# Patient Record
Sex: Female | Born: 1975
Health system: Southern US, Community
[De-identification: ages and names within clinical notes are randomized; demographics above are authoritative.]

## PROBLEM LIST (undated history)

## (undated) DIAGNOSIS — F329 Major depressive disorder, single episode, unspecified: Secondary | ICD-10-CM

## (undated) DIAGNOSIS — B1910 Unspecified viral hepatitis B without hepatic coma: Secondary | ICD-10-CM

## (undated) DIAGNOSIS — F32A Depression, unspecified: Secondary | ICD-10-CM

## (undated) DIAGNOSIS — I10 Essential (primary) hypertension: Secondary | ICD-10-CM

## (undated) DIAGNOSIS — M1990 Unspecified osteoarthritis, unspecified site: Secondary | ICD-10-CM

## (undated) DIAGNOSIS — T7840XA Allergy, unspecified, initial encounter: Secondary | ICD-10-CM

## (undated) DIAGNOSIS — E785 Hyperlipidemia, unspecified: Secondary | ICD-10-CM

## (undated) DIAGNOSIS — F319 Bipolar disorder, unspecified: Secondary | ICD-10-CM

## (undated) DIAGNOSIS — F419 Anxiety disorder, unspecified: Secondary | ICD-10-CM

## (undated) DIAGNOSIS — F209 Schizophrenia, unspecified: Secondary | ICD-10-CM

## (undated) HISTORY — DX: Unspecified osteoarthritis, unspecified site: M19.90

## (undated) HISTORY — DX: Allergy, unspecified, initial encounter: T78.40XA

## (undated) HISTORY — DX: Anxiety disorder, unspecified: F41.9

## (undated) HISTORY — DX: Hyperlipidemia, unspecified: E78.5

## (undated) HISTORY — PX: DIAGNOSTIC LAPAROSCOPY WITH REMOVAL OF ECTOPIC PREGNANCY: SHX6449

---

## 2002-02-28 ENCOUNTER — Emergency Department (HOSPITAL_COMMUNITY): Admission: EM | Admit: 2002-02-28 | Discharge: 2002-02-28 | Payer: Self-pay | Admitting: Emergency Medicine

## 2002-03-02 ENCOUNTER — Emergency Department (HOSPITAL_COMMUNITY): Admission: EM | Admit: 2002-03-02 | Discharge: 2002-03-02 | Payer: Self-pay | Admitting: Emergency Medicine

## 2003-03-03 ENCOUNTER — Emergency Department (HOSPITAL_COMMUNITY): Admission: EM | Admit: 2003-03-03 | Discharge: 2003-03-03 | Payer: Self-pay | Admitting: Internal Medicine

## 2005-07-18 ENCOUNTER — Emergency Department (HOSPITAL_COMMUNITY): Admission: EM | Admit: 2005-07-18 | Discharge: 2005-07-18 | Payer: Self-pay | Admitting: Emergency Medicine

## 2005-10-23 ENCOUNTER — Emergency Department (HOSPITAL_COMMUNITY): Admission: EM | Admit: 2005-10-23 | Discharge: 2005-10-23 | Payer: Self-pay | Admitting: Emergency Medicine

## 2005-11-01 ENCOUNTER — Ambulatory Visit: Payer: Self-pay | Admitting: Orthopedic Surgery

## 2007-11-24 ENCOUNTER — Emergency Department (HOSPITAL_COMMUNITY): Admission: EM | Admit: 2007-11-24 | Discharge: 2007-11-24 | Payer: Self-pay | Admitting: Emergency Medicine

## 2008-04-26 ENCOUNTER — Emergency Department (HOSPITAL_COMMUNITY): Admission: EM | Admit: 2008-04-26 | Discharge: 2008-04-26 | Payer: Self-pay | Admitting: Emergency Medicine

## 2008-09-25 ENCOUNTER — Emergency Department (HOSPITAL_COMMUNITY): Admission: EM | Admit: 2008-09-25 | Discharge: 2008-09-25 | Payer: Self-pay | Admitting: Emergency Medicine

## 2009-03-02 ENCOUNTER — Emergency Department (HOSPITAL_COMMUNITY): Admission: EM | Admit: 2009-03-02 | Discharge: 2009-03-03 | Payer: Self-pay | Admitting: Emergency Medicine

## 2009-11-20 ENCOUNTER — Emergency Department (HOSPITAL_COMMUNITY): Admission: EM | Admit: 2009-11-20 | Discharge: 2009-11-20 | Payer: Self-pay | Admitting: Emergency Medicine

## 2010-05-19 ENCOUNTER — Inpatient Hospital Stay (HOSPITAL_COMMUNITY)
Admission: EM | Admit: 2010-05-19 | Discharge: 2010-05-29 | Disposition: A | Discharge status: Other Acute Inpatient Hospital | Payer: Self-pay | Source: Home / Self Care | Admitting: Emergency Medicine

## 2010-05-20 ENCOUNTER — Ambulatory Visit: Payer: Self-pay | Admitting: Gastroenterology

## 2010-05-23 ENCOUNTER — Ambulatory Visit: Payer: Self-pay | Admitting: Gastroenterology

## 2010-05-25 ENCOUNTER — Ambulatory Visit: Payer: Self-pay | Admitting: Internal Medicine

## 2010-05-27 ENCOUNTER — Ambulatory Visit: Payer: Self-pay | Admitting: Internal Medicine

## 2010-05-28 ENCOUNTER — Ambulatory Visit: Payer: Self-pay | Admitting: Gastroenterology

## 2010-05-29 ENCOUNTER — Ambulatory Visit: Payer: Self-pay | Admitting: Psychiatry

## 2010-05-29 ENCOUNTER — Inpatient Hospital Stay (HOSPITAL_COMMUNITY): Admission: AD | Admit: 2010-05-29 | Discharge: 2010-06-08 | Payer: Self-pay | Admitting: Psychiatry

## 2010-05-29 ENCOUNTER — Telehealth: Payer: Self-pay | Admitting: Gastroenterology

## 2010-06-02 ENCOUNTER — Emergency Department (HOSPITAL_COMMUNITY): Admission: EM | Admit: 2010-06-02 | Discharge: 2010-06-02 | Payer: Self-pay | Admitting: Emergency Medicine

## 2010-06-26 ENCOUNTER — Ambulatory Visit: Payer: Self-pay | Admitting: Family Medicine

## 2010-07-03 LAB — CONVERTED CEMR LAB
ALT: 68 units/L — ABNORMAL HIGH (ref 0–35)
AST: 38 units/L — ABNORMAL HIGH (ref 0–37)
Albumin: 3.7 g/dL (ref 3.5–5.2)
Alkaline Phosphatase: 74 units/L (ref 39–117)
BUN: 9 mg/dL (ref 6–23)
Basophils Absolute: 0 10*3/uL (ref 0.0–0.1)
Basophils Relative: 1 % (ref 0–1)
CO2: 24 meq/L (ref 19–32)
Calcium: 9 mg/dL (ref 8.4–10.5)
Chloride: 107 meq/L (ref 96–112)
Creatinine, Ser: 0.78 mg/dL (ref 0.40–1.20)
Eosinophils Absolute: 0.1 10*3/uL (ref 0.0–0.7)
Eosinophils Relative: 1 % (ref 0–5)
Glucose, Bld: 88 mg/dL (ref 70–99)
HCT: 39.5 % (ref 36.0–46.0)
Hemoglobin: 13.1 g/dL (ref 12.0–15.0)
Lymphocytes Relative: 28 % (ref 12–46)
Lymphs Abs: 1.7 10*3/uL (ref 0.7–4.0)
MCHC: 33.2 g/dL (ref 30.0–36.0)
MCV: 99 fL (ref 78.0–100.0)
Monocytes Absolute: 0.8 10*3/uL (ref 0.1–1.0)
Monocytes Relative: 13 % — ABNORMAL HIGH (ref 3–12)
Neutro Abs: 3.5 10*3/uL (ref 1.7–7.7)
Neutrophils Relative %: 58 % (ref 43–77)
Platelets: 288 10*3/uL (ref 150–400)
Potassium: 4.2 meq/L (ref 3.5–5.3)
RBC: 3.99 M/uL (ref 3.87–5.11)
RDW: 15.2 % (ref 11.5–15.5)
Sodium: 142 meq/L (ref 135–145)
TSH: 0.435 microintl units/mL (ref 0.350–4.500)
Total Bilirubin: 4.7 mg/dL — ABNORMAL HIGH (ref 0.3–1.2)
Total Protein: 6.6 g/dL (ref 6.0–8.3)
Vit D, 25-Hydroxy: 14 ng/mL — ABNORMAL LOW (ref 30–89)
WBC: 6 10*3/uL (ref 4.0–10.5)

## 2010-07-23 ENCOUNTER — Encounter (INDEPENDENT_AMBULATORY_CARE_PROVIDER_SITE_OTHER): Payer: Self-pay | Admitting: *Deleted

## 2010-08-03 ENCOUNTER — Ambulatory Visit (HOSPITAL_COMMUNITY): Admission: RE | Admit: 2010-08-03 | Discharge: 2010-08-03 | Payer: Self-pay | Admitting: Family Medicine

## 2010-08-13 ENCOUNTER — Encounter (INDEPENDENT_AMBULATORY_CARE_PROVIDER_SITE_OTHER): Payer: Self-pay | Admitting: *Deleted

## 2010-08-23 ENCOUNTER — Emergency Department (HOSPITAL_COMMUNITY)
Admission: EM | Admit: 2010-08-23 | Discharge: 2010-08-23 | Payer: Self-pay | Source: Home / Self Care | Admitting: Family Medicine

## 2010-08-23 ENCOUNTER — Emergency Department (HOSPITAL_COMMUNITY): Admission: EM | Admit: 2010-08-23 | Discharge: 2010-08-24 | Payer: Self-pay | Admitting: Emergency Medicine

## 2010-10-01 ENCOUNTER — Emergency Department (HOSPITAL_COMMUNITY): Admission: EM | Admit: 2010-10-01 | Discharge: 2010-04-19 | Payer: Self-pay | Admitting: Emergency Medicine

## 2010-10-20 ENCOUNTER — Encounter (INDEPENDENT_AMBULATORY_CARE_PROVIDER_SITE_OTHER): Payer: Self-pay | Admitting: Family Medicine

## 2010-10-20 LAB — CONVERTED CEMR LAB: hCG, Beta Chain, Quant, S: 245.2 milliintl units/mL

## 2010-10-25 ENCOUNTER — Inpatient Hospital Stay (HOSPITAL_COMMUNITY)
Admission: AD | Admit: 2010-10-25 | Discharge: 2010-10-26 | Payer: Self-pay | Source: Home / Self Care | Attending: Obstetrics & Gynecology | Admitting: Obstetrics & Gynecology

## 2010-10-29 ENCOUNTER — Ambulatory Visit (HOSPITAL_COMMUNITY)
Admission: RE | Admit: 2010-10-29 | Discharge: 2010-10-29 | Payer: Self-pay | Source: Home / Self Care | Attending: Obstetrics & Gynecology | Admitting: Obstetrics & Gynecology

## 2010-10-29 LAB — HCG, QUANTITATIVE, PREGNANCY: hCG, Beta Chain, Quant, S: 3780 m[IU]/mL — ABNORMAL HIGH (ref <5)

## 2010-11-10 ENCOUNTER — Inpatient Hospital Stay (HOSPITAL_COMMUNITY)
Admission: AD | Admit: 2010-11-10 | Discharge: 2010-11-10 | Disposition: A | Discharge status: Other Acute Inpatient Hospital | Payer: Self-pay | Source: Home / Self Care | Attending: Obstetrics & Gynecology | Admitting: Obstetrics & Gynecology

## 2010-11-11 LAB — CBC
HCT: 36.9 % (ref 36.0–46.0)
Hemoglobin: 13 g/dL (ref 12.0–15.0)
MCH: 33.2 pg (ref 26.0–34.0)
MCHC: 35.2 g/dL (ref 30.0–36.0)
MCV: 94.1 fL (ref 78.0–100.0)
Platelets: 193 10*3/uL (ref 150–400)
RBC: 3.92 MIL/uL (ref 3.87–5.11)
RDW: 13.4 % (ref 11.5–15.5)
WBC: 12.4 10*3/uL — ABNORMAL HIGH (ref 4.0–10.5)

## 2010-11-11 LAB — COMPREHENSIVE METABOLIC PANEL
ALT: 12 U/L (ref 0–35)
AST: 15 U/L (ref 0–37)
Albumin: 3.4 g/dL — ABNORMAL LOW (ref 3.5–5.2)
Alkaline Phosphatase: 41 U/L (ref 39–117)
BUN: 5 mg/dL — ABNORMAL LOW (ref 6–23)
CO2: 22 mEq/L (ref 19–32)
Calcium: 9.4 mg/dL (ref 8.4–10.5)
Chloride: 106 mEq/L (ref 96–112)
Creatinine, Ser: 0.6 mg/dL (ref 0.4–1.2)
GFR calc Af Amer: >60 mL/min (ref >60)
GFR calc non Af Amer: >60 mL/min (ref >60)
Glucose, Bld: 94 mg/dL (ref 70–99)
Potassium: 3.7 mEq/L (ref 3.5–5.1)
Sodium: 135 mEq/L (ref 135–145)
Total Bilirubin: 0.7 mg/dL (ref 0.3–1.2)
Total Protein: 6.2 g/dL (ref 6.0–8.3)

## 2010-11-11 LAB — TYPE AND SCREEN
ABO/RH(D): B POS
Antibody Screen: NEGATIVE

## 2010-11-11 LAB — HCG, QUANTITATIVE, PREGNANCY: hCG, Beta Chain, Quant, S: 10335 m[IU]/mL — ABNORMAL HIGH (ref <5)

## 2010-11-19 ENCOUNTER — Ambulatory Visit: Admit: 2010-11-19 | Payer: Self-pay | Admitting: Obstetrics and Gynecology

## 2010-11-24 NOTE — Miscellaneous (Signed)
Summary: CONSULTATION  Clinical Lists Changes  NAME:  STEPHANEY, [name]              ACCOUNT NO.:  [id number]      MEDICAL RECORD NO.:  [id number]          PATIENT TYPE:  INP      LOCATION:  A332                          FACILITY:  [facility]      PHYSICIAN:  Jonette Eva, M.D.     DATE OF BIRTH:  [birth year]      DATE OF CONSULTATION:  05/20/2010   DATE OF DISCHARGE:                                    CONSULTATION      HISTORY OF PRESENT ILLNESS:  The patient is a 35 year old African   American female who presented to the hospital with complaints of 2-week   history of nausea, vomiting, abdominal distention, hematemesis.  She   also yesterday developed chest discomfort associated with shortness of   breath which really prompted the ED visit.  Upon evaluation, she had a   chest x-ray which was unremarkable although lung volumes were low.  A CT   of the abdomen and pelvis was unremarkable except for diverticula, but   no evidence of diverticulosis.  Liver appeared normal.  Lab work showed   total bilirubin of 10.2, alk-phos 141, AST 1296, ALT 1884, albumin 3.1.   Amylase and lipase were normal.  Her INR was 1.23, PTT 41.  Pregnancy   test negative.  Cardiac markers point of care were negative.  This   morning her INR is 1.24, PTT is 41, total bilirubin 10.8, alk phos 139,   AST 1353, ALT 1947.  BUN and creatinine is normal.  Acetaminophen level   less than 10.  She has abdominal ultrasound scheduled.      The patient states she has been having unprotected sex with her fiance   who was diagnosed with hepatitis back in May according to the patient.   She states that he was told that he was cleared.  Apparently he took   some shots.  She states that they were told it could not be transmitted   sexually.  She states the last time she ate out prior to her illness was   pizza.  She denies any diarrhea associated with this illness.  Bowel   movements have been normal.  No blood in stool or melena.      MEDICATIONS AT HOME:   1. Alleve p.r.n. but not daily.   2. Tylenol p.r.n. headache, but not daily.      ALLERGIES:  No known drug allergies.      PAST MEDICAL HISTORY:   1. Hypertension.   2. Asthma.   3. Psoriasis.   4. Headaches.   5. Negative for liver disease.  She states her mother has history of       blood clots and diabetes.      PAST SURGICAL HISTORY:  Dilation and curettage for miscarriage.      SOCIAL HISTORY:  She is engaged to be married.  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  She quit   smoking 2 days ago due to the nausea, vomiting.  She is going to try to   maintain cessation.  Denies any alcohol or drug use.      REVIEW OF SYSTEMS:  See HPI for GI and constitutional.  She has been   feeling very fatigued.  CARDIOPULMONARY:  See HPI.  GENITOURINARY:  No   dysuria, hematuria.  She has had urinary frequency.      PHYSICAL EXAMINATION:  VITAL SIGNS:  Temperature 98, pulse 54,   respirations 20, blood pressure 122/65, weight is 96.6 kg, height 64   inches.   GENERAL:  Pleasant, obese, black female in no acute distress.   SKIN:  Warm and dry.   HEENT:  Sclerae are icteric.  Oropharyngeal mucosa moist.   CHEST:  Lungs clear to auscultation.   CARDIAC:  Exam reveals regular rate and rhythm.  No murmurs.   ABDOMEN:  Obese, soft, nontender, nondistended.  No organomegaly or   masses.  No abdominal bruits or hernias.  No rebound or guarding.   LOWER EXTREMITIES:  No edema.      LABORATORY DATA:  As outlined above.      IMPRESSION:  The patient is a pleasant 35 year old lady with acute   hepatitis, unclear etiology.  She has had unprotected sexual contact   with her boyfriend who has had a history of hepatitis, unclear type, but   apparently received shots back in May.  No further details are available   at this time.  Differential diagnosis at this time includes acute viral   hepatitis, autoimmune hepatitis, gout  obstructing jaundice on medication   effect.  She also has hematemesis likely due to Mallory-Weiss tear.      RECOMMENDATIONS:   1. Labs including markers for autoimmune hepatitis and PBC, LFTs,       viral markers.   2. Follow-up abdominal ultrasound.   3. Will discuss further with Dr. Darrick Penna regarding possible need for       EGD given hematemesis.      ADDEDNDUM [address]: ELEVATED HFP 2o TO ACUTE HEP B.         Tana Coast, P.A.         ______________________________   Jonette Eva, M.D.         LL/MEDQ  D:  05/20/2010  T:  05/20/2010  Job:  [id number]      cc:   Melissa L. Ladona Ridgel, MD      Electronically Signed by Tana Coast P.A. on 06/01/2010 08:40:24 AM   Electronically Signed by Jonette Eva M.D. on 07/10/2010 03:25:46 PM

## 2010-11-24 NOTE — Progress Notes (Signed)
Summary: ACUTE HEP B  Pt admitted with Acute Hep B. OPV w/ SLF in one month E:30 visit. [place] Bali MD  May 29, 2010 1:32 PM  Appended Document: ACUTE HEP B LMOM for pt to call us to set up OV  Appended Document: ACUTE HEP B Pt aware of appt for 07/01/10 @ 10am w/SF

## 2010-11-24 NOTE — Letter (Signed)
Summary: Recall Office Visit  [facility] [place] [facility]  [address]   [place], [place] [address]   Phone: [phone]  Fax: [phone]      August 13, 2010   [name] [address] [place], [place]  [address] Nov 18, 1975   Dear Ms. Berrocal,   According to our records, it is time for you to schedule a follow-up office visit with [place].   At your convenience, please call [phone] to schedule an office visit. If you have any questions, concerns, or feel that this letter is in error, we would appreciate your call.   Sincerely,    Rosine Beat  [facility] Ph: [phone]   Fax: [phone]

## 2010-11-24 NOTE — Letter (Signed)
Summary: Recall Office Visit  [facility]  [address]   [place], [place] [address]   Phone: [phone]  Fax: [phone]      August 13, 2010   [name] [address] [place] [address] [place], [place]  [address] [birth year]   Dear [name],   According to our records, it is time for you to schedule a follow-up office visit with [place].   At your convenience, please call [phone] to schedule an office visit. If you have any questions, concerns, or feel that this letter is in error, we would appreciate your call.   Sincerely,    Rosine Beat  [facility] Ph: [phone]   Fax: [phone]  Appended Document: Recall Office Visit PATIENT CALLED AND STATED THAT SHE HAS MOVED TO [place] AND IT IS HARD FOR HER TO GET TRANSPORTATION TO [place] ANYMORE. SHE WILL CALL AND SCHEDULE AN APPT WHEN SHE CAN GET TRANSPORTATION TO GET HERE

## 2011-01-04 LAB — WET PREP, GENITAL: Clue Cells Wet Prep HPF POC: NONE SEEN

## 2011-01-04 LAB — CBC
Hemoglobin: 13.4 g/dL (ref 12.0–15.0)
RBC: 4.04 MIL/uL (ref 3.87–5.11)

## 2011-01-04 LAB — URINALYSIS, ROUTINE W REFLEX MICROSCOPIC
Leukocytes, UA: NEGATIVE
Nitrite: NEGATIVE
Protein, ur: NEGATIVE mg/dL
Urobilinogen, UA: 1 mg/dL (ref 0.0–1.0)

## 2011-01-04 LAB — HCG, QUANTITATIVE, PREGNANCY: hCG, Beta Chain, Quant, S: 1518 m[IU]/mL — ABNORMAL HIGH (ref <5)

## 2011-01-04 LAB — ABO/RH: ABO/RH(D): B POS

## 2011-01-04 LAB — GC/CHLAMYDIA PROBE AMP, GENITAL
Chlamydia, DNA Probe: NEGATIVE
GC Probe Amp, Genital: NEGATIVE

## 2011-01-04 LAB — URINE MICROSCOPIC-ADD ON

## 2011-01-06 LAB — URINALYSIS, ROUTINE W REFLEX MICROSCOPIC
Bilirubin Urine: NEGATIVE
Glucose, UA: NEGATIVE mg/dL
Hgb urine dipstick: NEGATIVE
Ketones, ur: 15 mg/dL — AB
Nitrite: NEGATIVE
pH: 6 (ref 5.0–8.0)

## 2011-01-06 LAB — CBC
Hemoglobin: 14.1 g/dL (ref 12.0–15.0)
MCHC: 33.3 g/dL (ref 30.0–36.0)
Platelets: 232 10*3/uL (ref 150–400)
RBC: 4.3 MIL/uL (ref 3.87–5.11)

## 2011-01-06 LAB — DIFFERENTIAL
Eosinophils Absolute: 0.1 10*3/uL (ref 0.0–0.7)
Eosinophils Relative: 1 % (ref 0–5)
Lymphs Abs: 4.3 10*3/uL — ABNORMAL HIGH (ref 0.7–4.0)
Monocytes Absolute: 0.6 10*3/uL (ref 0.1–1.0)

## 2011-01-06 LAB — COMPREHENSIVE METABOLIC PANEL
ALT: 16 U/L (ref 0–35)
AST: 22 U/L (ref 0–37)
Albumin: 4 g/dL (ref 3.5–5.2)
CO2: 25 mEq/L (ref 19–32)
Calcium: 9.8 mg/dL (ref 8.4–10.5)
Chloride: 103 mEq/L (ref 96–112)
GFR calc Af Amer: >60 mL/min (ref >60)
GFR calc non Af Amer: >60 mL/min (ref >60)
Sodium: 138 mEq/L (ref 135–145)

## 2011-01-06 LAB — URINE MICROSCOPIC-ADD ON

## 2011-01-06 LAB — LIPASE, BLOOD: Lipase: 19 U/L (ref 11–59)

## 2011-01-06 LAB — POCT PREGNANCY, URINE: Preg Test, Ur: NEGATIVE

## 2011-01-08 LAB — PROTIME-INR
INR: 1.28 (ref 0.00–1.49)
INR: 1.34 (ref 0.00–1.49)
INR: 1.21 (ref 0.00–1.49)
INR: 1.22 (ref 0.00–1.49)
Prothrombin Time: 14.9 seconds (ref 11.6–15.2)
Prothrombin Time: 15.5 seconds — ABNORMAL HIGH (ref 11.6–15.2)
Prothrombin Time: 15.6 seconds — ABNORMAL HIGH (ref 11.6–15.2)

## 2011-01-08 LAB — DIFFERENTIAL
Basophils Absolute: 0 10*3/uL (ref 0.0–0.1)
Basophils Relative: 0 % (ref 0–1)
Eosinophils Absolute: 0.2 10*3/uL (ref 0.0–0.7)
Lymphocytes Relative: 29 % (ref 12–46)
Lymphs Abs: 2.7 10*3/uL (ref 0.7–4.0)
Monocytes Relative: 9 % (ref 3–12)
Neutro Abs: 6.6 10*3/uL (ref 1.7–7.7)
Neutrophils Relative %: 61 % (ref 43–77)
Neutrophils Relative %: 69 % (ref 43–77)

## 2011-01-08 LAB — COMPREHENSIVE METABOLIC PANEL
ALT: 1002 U/L — ABNORMAL HIGH (ref 0–35)
AST: 748 U/L — ABNORMAL HIGH (ref 0–37)
Albumin: 2.4 g/dL — ABNORMAL LOW (ref 3.5–5.2)
Alkaline Phosphatase: 119 U/L — ABNORMAL HIGH (ref 39–117)
Alkaline Phosphatase: 130 U/L — ABNORMAL HIGH (ref 39–117)
Alkaline Phosphatase: 128 U/L — ABNORMAL HIGH (ref 39–117)
BUN: 2 mg/dL — ABNORMAL LOW (ref 6–23)
BUN: 4 mg/dL — ABNORMAL LOW (ref 6–23)
CO2: 23 mEq/L (ref 19–32)
CO2: 23 mEq/L (ref 19–32)
CO2: 23 mEq/L (ref 19–32)
Calcium: 8.6 mg/dL (ref 8.4–10.5)
Chloride: 103 mEq/L (ref 96–112)
Creatinine, Ser: 0.79 mg/dL (ref 0.4–1.2)
Creatinine, Ser: 0.84 mg/dL (ref 0.4–1.2)
GFR calc Af Amer: >60 mL/min (ref >60)
GFR calc non Af Amer: >60 mL/min (ref >60)
GFR calc non Af Amer: >60 mL/min (ref >60)
Glucose, Bld: 110 mg/dL — ABNORMAL HIGH (ref 70–99)
Glucose, Bld: 82 mg/dL (ref 70–99)
Glucose, Bld: 81 mg/dL (ref 70–99)
Potassium: 3.7 mEq/L (ref 3.5–5.1)
Potassium: 3.7 mEq/L (ref 3.5–5.1)
Sodium: 137 mEq/L (ref 135–145)
Total Bilirubin: 16.9 mg/dL — ABNORMAL HIGH (ref 0.3–1.2)
Total Bilirubin: 27.4 mg/dL (ref 0.3–1.2)
Total Protein: 5.6 g/dL — ABNORMAL LOW (ref 6.0–8.3)
Total Protein: 6.8 g/dL (ref 6.0–8.3)

## 2011-01-08 LAB — HEPATIC FUNCTION PANEL
ALT: 1180 U/L — ABNORMAL HIGH (ref 0–35)
ALT: 1223 U/L — ABNORMAL HIGH (ref 0–35)
ALT: 1509 U/L — ABNORMAL HIGH (ref 0–35)
AST: 1037 U/L — ABNORMAL HIGH (ref 0–37)
Albumin: 2.3 g/dL — ABNORMAL LOW (ref 3.5–5.2)
Albumin: 2.4 g/dL — ABNORMAL LOW (ref 3.5–5.2)
Alkaline Phosphatase: 132 U/L — ABNORMAL HIGH (ref 39–117)
Alkaline Phosphatase: 119 U/L — ABNORMAL HIGH (ref 39–117)
Alkaline Phosphatase: 129 U/L — ABNORMAL HIGH (ref 39–117)
Alkaline Phosphatase: 121 U/L — ABNORMAL HIGH (ref 39–117)
Bilirubin, Direct: 13.1 mg/dL — ABNORMAL HIGH (ref 0.0–0.3)
Bilirubin, Direct: 15.7 mg/dL — ABNORMAL HIGH (ref 0.0–0.3)
Indirect Bilirubin: 10.1 mg/dL — ABNORMAL HIGH (ref 0.3–0.9)
Indirect Bilirubin: 13.2 mg/dL — ABNORMAL HIGH (ref 0.3–0.9)
Total Bilirubin: 23.9 mg/dL (ref 0.3–1.2)
Total Bilirubin: 18.8 mg/dL (ref 0.3–1.2)
Total Bilirubin: 19.2 mg/dL (ref 0.3–1.2)
Total Bilirubin: 28.9 mg/dL (ref 0.3–1.2)
Total Protein: 5.9 g/dL — ABNORMAL LOW (ref 6.0–8.3)

## 2011-01-08 LAB — CBC
HCT: 38.3 % (ref 36.0–46.0)
Hemoglobin: 13.2 g/dL (ref 12.0–15.0)
Hemoglobin: 13.2 g/dL (ref 12.0–15.0)
MCH: 33.8 pg (ref 26.0–34.0)
MCH: 34.2 pg — ABNORMAL HIGH (ref 26.0–34.0)
MCHC: 34.5 g/dL (ref 30.0–36.0)
MCV: 97.7 fL (ref 78.0–100.0)
MCV: 97.8 fL (ref 78.0–100.0)
Platelets: 179 10*3/uL (ref 150–400)
RBC: 3.86 MIL/uL — ABNORMAL LOW (ref 3.87–5.11)
RDW: 18 % — ABNORMAL HIGH (ref 11.5–15.5)
WBC: 9.2 10*3/uL (ref 4.0–10.5)

## 2011-01-08 LAB — WOUND CULTURE: Gram Stain: NONE SEEN

## 2011-01-08 LAB — POTASSIUM: Potassium: 4.5 mEq/L (ref 3.5–5.1)

## 2011-01-09 LAB — COMPREHENSIVE METABOLIC PANEL
ALT: 1754 U/L — ABNORMAL HIGH (ref 0–35)
ALT: 1947 U/L — ABNORMAL HIGH (ref 0–35)
AST: 1296 U/L — ABNORMAL HIGH (ref 0–37)
Albumin: 3.1 g/dL — ABNORMAL LOW (ref 3.5–5.2)
Alkaline Phosphatase: 139 U/L — ABNORMAL HIGH (ref 39–117)
BUN: 2 mg/dL — ABNORMAL LOW (ref 6–23)
BUN: 4 mg/dL — ABNORMAL LOW (ref 6–23)
CO2: 21 mEq/L (ref 19–32)
CO2: 24 mEq/L (ref 19–32)
Calcium: 8.3 mg/dL — ABNORMAL LOW (ref 8.4–10.5)
Calcium: 8.5 mg/dL (ref 8.4–10.5)
Chloride: 107 mEq/L (ref 96–112)
Chloride: 108 mEq/L (ref 96–112)
Creatinine, Ser: 0.7 mg/dL (ref 0.4–1.2)
Creatinine, Ser: 0.76 mg/dL (ref 0.4–1.2)
GFR calc Af Amer: >60 mL/min (ref >60)
GFR calc non Af Amer: >60 mL/min (ref >60)
GFR calc non Af Amer: >60 mL/min (ref >60)
Glucose, Bld: 86 mg/dL (ref 70–99)
Glucose, Bld: 88 mg/dL (ref 70–99)
Potassium: 3.4 mEq/L — ABNORMAL LOW (ref 3.5–5.1)
Sodium: 137 mEq/L (ref 135–145)
Sodium: 139 mEq/L (ref 135–145)
Total Bilirubin: 10.2 mg/dL — ABNORMAL HIGH (ref 0.3–1.2)
Total Bilirubin: 10.8 mg/dL — ABNORMAL HIGH (ref 0.3–1.2)
Total Bilirubin: 17.4 mg/dL — ABNORMAL HIGH (ref 0.3–1.2)
Total Protein: 6.2 g/dL (ref 6.0–8.3)

## 2011-01-09 LAB — BASIC METABOLIC PANEL
BUN: 1 mg/dL — ABNORMAL LOW (ref 6–23)
Calcium: 8.8 mg/dL (ref 8.4–10.5)
Creatinine, Ser: 0.67 mg/dL (ref 0.4–1.2)
GFR calc Af Amer: >60 mL/min (ref >60)
GFR calc non Af Amer: >60 mL/min (ref >60)
GFR calc non Af Amer: >60 mL/min (ref >60)
Glucose, Bld: 80 mg/dL (ref 70–99)
Potassium: 3.6 mEq/L (ref 3.5–5.1)
Sodium: 138 mEq/L (ref 135–145)

## 2011-01-09 LAB — HEPATIC FUNCTION PANEL
ALT: 1810 U/L — ABNORMAL HIGH (ref 0–35)
ALT: 1885 U/L — ABNORMAL HIGH (ref 0–35)
AST: 1366 U/L — ABNORMAL HIGH (ref 0–37)
AST: 1402 U/L — ABNORMAL HIGH (ref 0–37)
Albumin: 2.8 g/dL — ABNORMAL LOW (ref 3.5–5.2)
Albumin: 3 g/dL — ABNORMAL LOW (ref 3.5–5.2)
Alkaline Phosphatase: 137 U/L — ABNORMAL HIGH (ref 39–117)
Alkaline Phosphatase: 155 U/L — ABNORMAL HIGH (ref 39–117)
Bilirubin, Direct: 7.6 mg/dL — ABNORMAL HIGH (ref 0.0–0.3)
Bilirubin, Direct: 8.5 mg/dL — ABNORMAL HIGH (ref 0.0–0.3)
Indirect Bilirubin: 4.8 mg/dL — ABNORMAL HIGH (ref 0.3–0.9)
Indirect Bilirubin: 5.6 mg/dL — ABNORMAL HIGH (ref 0.3–0.9)
Indirect Bilirubin: 6.1 mg/dL — ABNORMAL HIGH (ref 0.3–0.9)
Total Bilirubin: 14.1 mg/dL — ABNORMAL HIGH (ref 0.3–1.2)
Total Bilirubin: 16 mg/dL — ABNORMAL HIGH (ref 0.3–1.2)
Total Protein: 5.7 g/dL — ABNORMAL LOW (ref 6.0–8.3)
Total Protein: 5.9 g/dL — ABNORMAL LOW (ref 6.0–8.3)
Total Protein: 6.1 g/dL (ref 6.0–8.3)

## 2011-01-09 LAB — HEPATITIS C VRS RNA DETECT BY PCR-QUAL: Hepatitis C Vrs RNA by PCR-Qual: NEGATIVE

## 2011-01-09 LAB — DIFFERENTIAL
Basophils Absolute: 0 10*3/uL (ref 0.0–0.1)
Basophils Absolute: 0 10*3/uL (ref 0.0–0.1)
Basophils Relative: 0 % (ref 0–1)
Eosinophils Relative: 1 % (ref 0–5)
Eosinophils Relative: 1 % (ref 0–5)
Lymphocytes Relative: 22 % (ref 12–46)
Lymphocytes Relative: 32 % (ref 12–46)
Lymphs Abs: 1.7 10*3/uL (ref 0.7–4.0)
Monocytes Absolute: 0.7 10*3/uL (ref 0.1–1.0)
Monocytes Absolute: 0.8 10*3/uL (ref 0.1–1.0)
Neutro Abs: 4.7 10*3/uL (ref 1.7–7.7)
Neutro Abs: 5.2 10*3/uL (ref 1.7–7.7)

## 2011-01-09 LAB — CBC
HCT: 37.2 % (ref 36.0–46.0)
MCH: 33.6 pg (ref 26.0–34.0)
MCHC: 33.9 g/dL (ref 30.0–36.0)
MCHC: 34.5 g/dL (ref 30.0–36.0)
MCV: 99 fL (ref 78.0–100.0)
Platelets: 166 10*3/uL (ref 150–400)
Platelets: 193 10*3/uL (ref 150–400)
RDW: 15 % (ref 11.5–15.5)
WBC: 8.1 10*3/uL (ref 4.0–10.5)

## 2011-01-09 LAB — LIPASE, BLOOD: Lipase: 36 U/L (ref 11–59)

## 2011-01-09 LAB — URINE MICROSCOPIC-ADD ON

## 2011-01-09 LAB — PROTIME-INR
INR: 1.23 (ref 0.00–1.49)
INR: 1.28 (ref 0.00–1.49)
Prothrombin Time: 15.9 seconds — ABNORMAL HIGH (ref 11.6–15.2)
Prothrombin Time: 15.9 seconds — ABNORMAL HIGH (ref 11.6–15.2)

## 2011-01-09 LAB — IGG, IGA, IGM
IgA: 296 mg/dL (ref 68–378)
IgG (Immunoglobin G), Serum: 1490 mg/dL (ref 694–1618)
IgM, Serum: 126 mg/dL (ref 60–263)

## 2011-01-09 LAB — TSH: TSH: 1.139 u[IU]/mL (ref 0.350–4.500)

## 2011-01-09 LAB — MAGNESIUM: Magnesium: 1.7 mg/dL (ref 1.5–2.5)

## 2011-01-09 LAB — ANA: Anti Nuclear Antibody(ANA): POSITIVE — AB

## 2011-01-09 LAB — URINALYSIS, ROUTINE W REFLEX MICROSCOPIC
Glucose, UA: 100 mg/dL — AB
Ketones, ur: NEGATIVE mg/dL
Urobilinogen, UA: 4 mg/dL — ABNORMAL HIGH (ref 0.0–1.0)

## 2011-01-09 LAB — HIV ANTIBODY (ROUTINE TESTING W REFLEX): HIV: NONREACTIVE

## 2011-01-09 LAB — URINE CULTURE
Colony Count: NO GROWTH
Culture: NO GROWTH

## 2011-01-09 LAB — APTT
aPTT: 39 seconds — ABNORMAL HIGH (ref 24–37)
aPTT: 41 seconds — ABNORMAL HIGH (ref 24–37)

## 2011-01-09 LAB — ANTI-SMOOTH MUSCLE ANTIBODY, IGG: F-Actin IgG: <20 U (ref <20)

## 2011-01-09 LAB — AMMONIA: Ammonia: 45 umol/L — ABNORMAL HIGH (ref 11–35)

## 2011-01-09 LAB — POCT CARDIAC MARKERS
CKMB, poc: <1 ng/mL — ABNORMAL LOW (ref 1.0–8.0)
Myoglobin, poc: 24 ng/mL (ref 12–200)

## 2011-01-09 LAB — HEPATITIS PANEL, ACUTE
HCV Ab: NEGATIVE
Hep A IgM: NEGATIVE
Hep B C IgM: POSITIVE — AB

## 2011-01-09 LAB — ANTI-NUCLEAR AB-TITER (ANA TITER)

## 2011-01-09 LAB — HIV-1 RNA ULTRAQUANT REFLEX TO GENTYP+: HIV-1 RNA Quant, Log: <1.68 {Log} (ref <1.68)

## 2011-01-09 LAB — T4, FREE: Free T4: 1.24 ng/dL (ref 0.80–1.80)

## 2011-01-09 LAB — HEPATITIS DELTA VIRUS ANTIGEN

## 2011-07-12 IMAGING — US US OB COMP LESS 14 WK
1 series · 13 of 23 positions shown · non-contrast
Comparison: none

[Series 1: us ob comp less 14 wks · 13 of 23 slices shown]
[im 1/23]
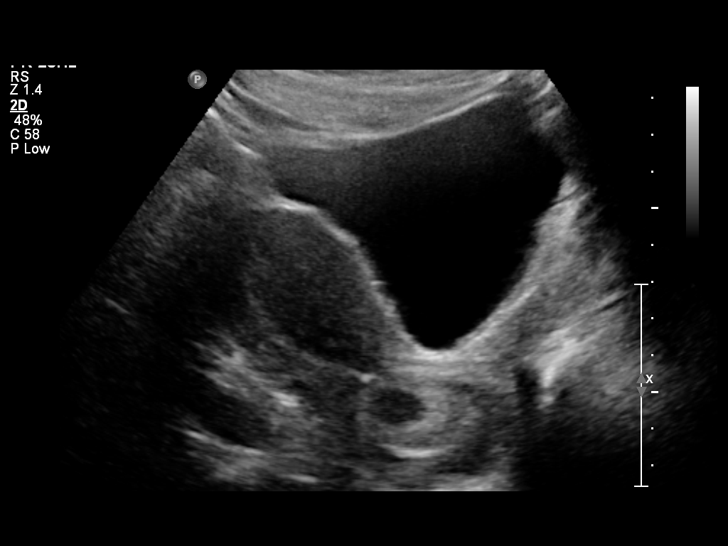
[im 3/23]
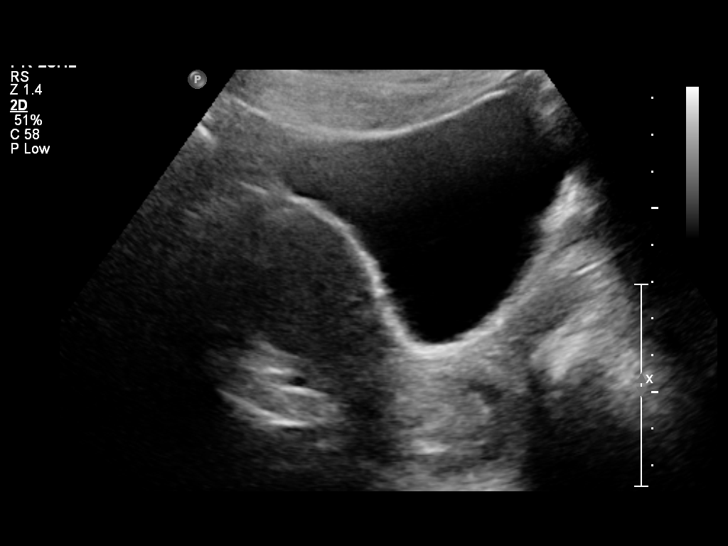
[im 5/23]
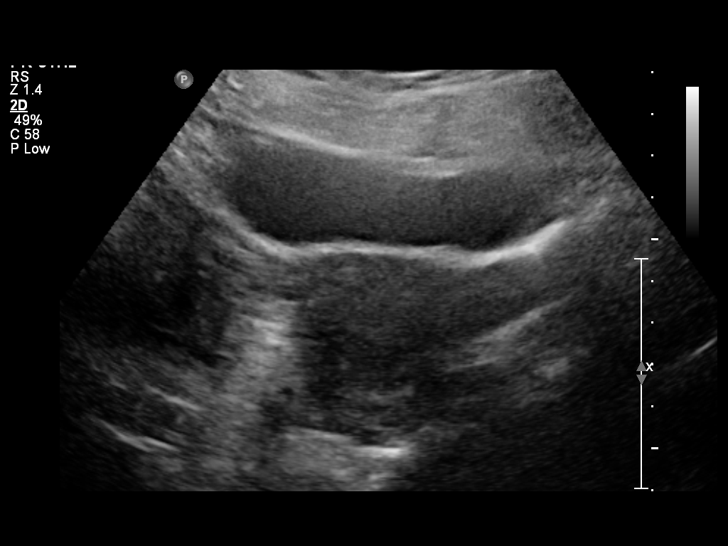
[im 7/23]
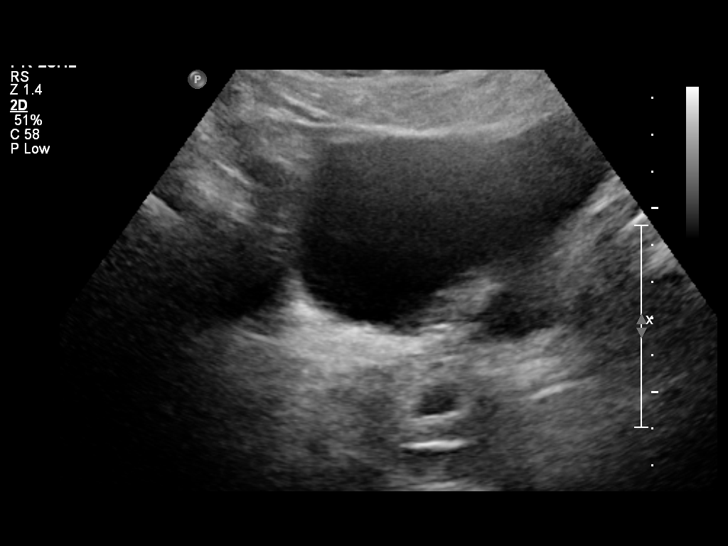
[im 8/23]
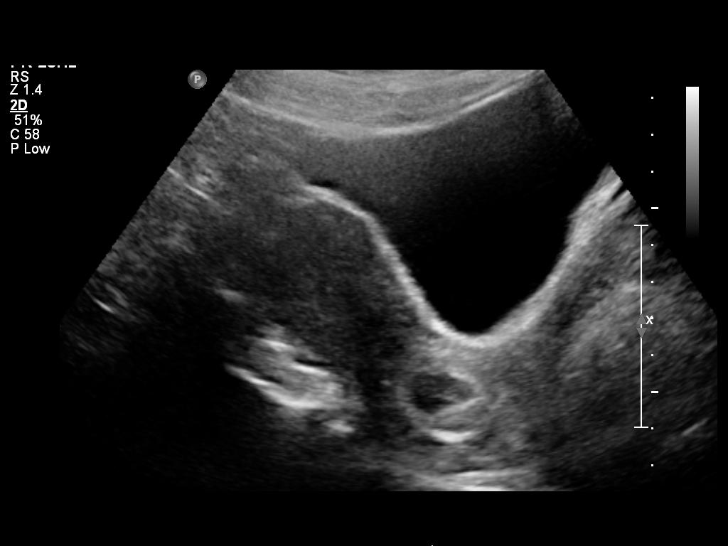
[im 10/23]
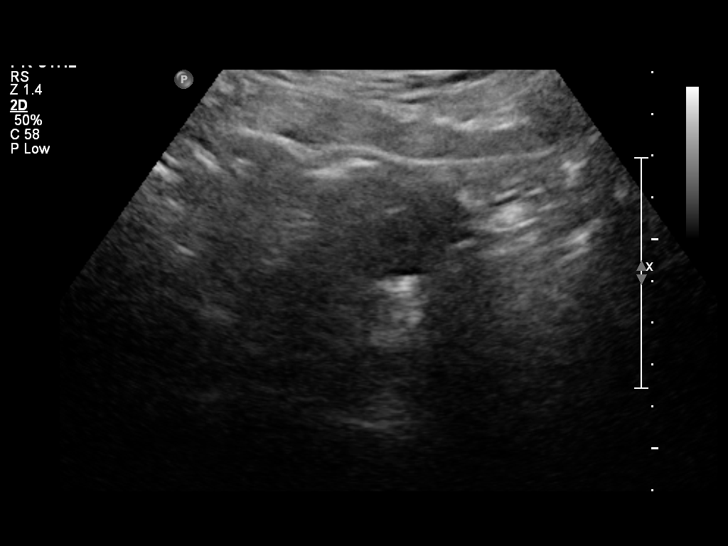
[im 12/23]
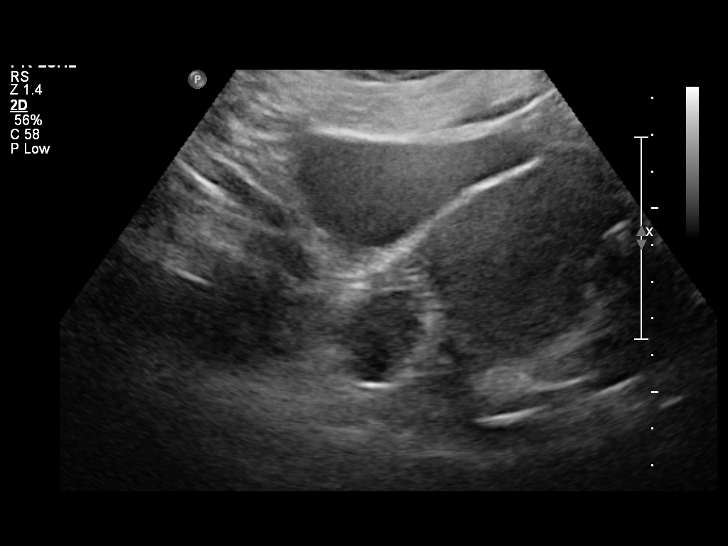
[im 14/23]
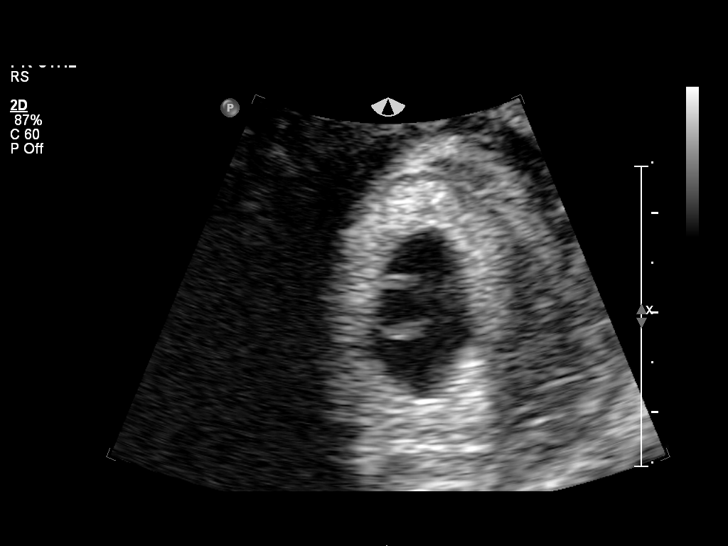
[im 16/23]
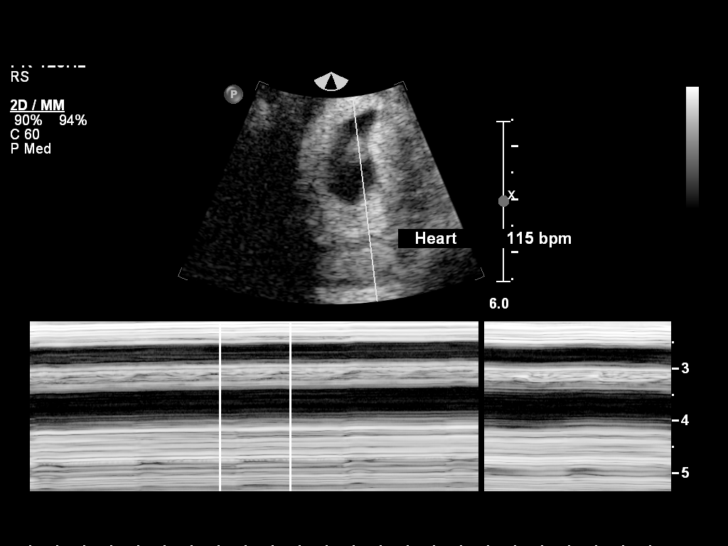
[im 17/23]
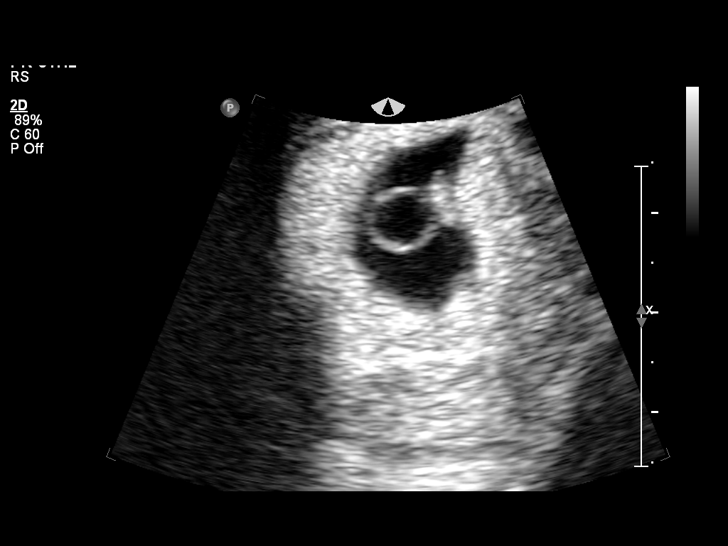
[im 19/23]
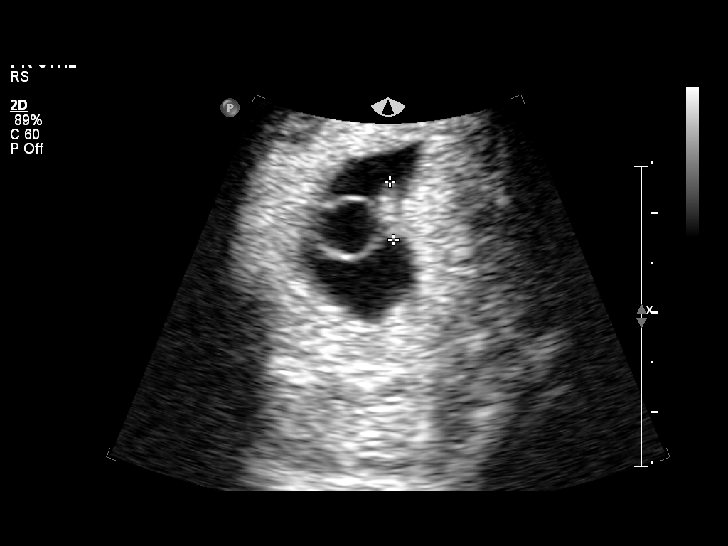
[im 21/23]
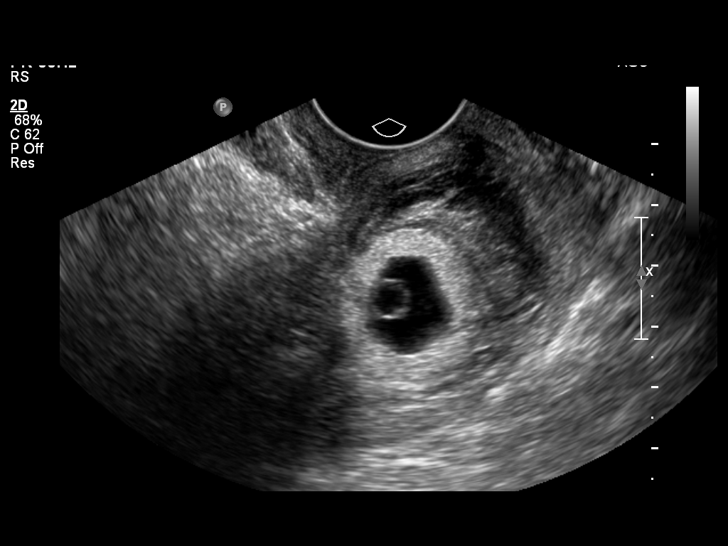
[im 23/23]
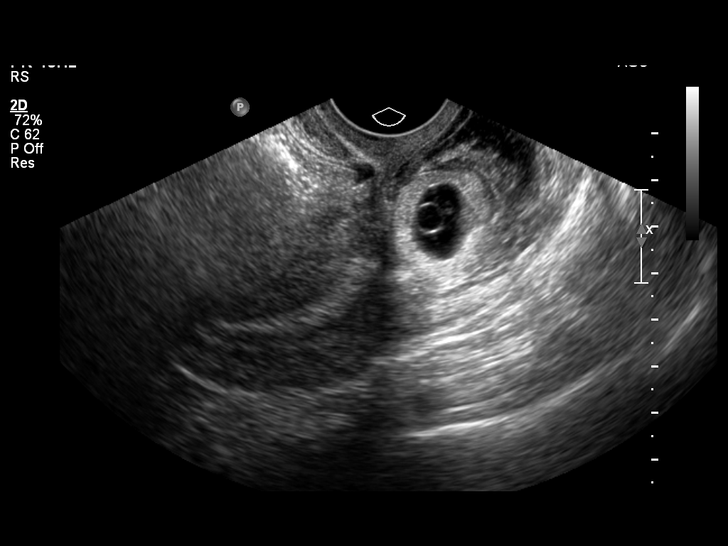

[13 of 23 positions shown; findings below may reference images not displayed]

OBSTETRICS REPORT
                      (Signed Final 11/10/2010 [DATE])

Procedures

 US OB COMP LESS 14 WKS                                [address].0
 US OB Transvaginal Modify                             [address]
Indications

 Vaginal bleeding, unknown etiology
 Advanced maternal age (AMA), Multigravida
 Hepatitis B
 Poor obstetric history: Previous neonatal death
 Hypertension - Chronic/Pre-existing
 Obesity
 Cigarette smoker
 Asthma
 Pain - Abdominal/Back
 Bipolor disorder
 Schizophrenia
 PTSD
Fetal Evaluation

 Preg. Location:    Cervix
 Gest. Sac:         Cervix
 Yolk Sac:          Visualized
 Fetal Pole:        Visualized
 Fetal Heart Rate:  115                          bpm
 Cardiac Activity:  Observed
Biometry

 CRL:      5.8  mm     G. Age:  6w 3d                  EDD:    07/03/11
Gestational Age

 LMP:           8w 2d         Date:  09/13/10                 EDD:   06/20/11
 Best:          6w 3d      Det. By:  U/S C R L (11/10/10)     EDD:   07/03/11
Cervix Uterus Adnexa

 Cervix:       Living embryo seen in distended cervix.
 Cul De Sac:   No free fluid seen.

 Left Ovary:    Within normal limits.
 Right Ovary:   Within normal limits.
 Adnexa:     No abnormality visualized.
Impression

 Living cervical ectopic pregnancy. EGA based on crown rump
 length is 6w 3d.
 Report was telephoned to the patient's nurse in the [facility],
 [place], by Angi Billiot, the sonographer at
 approximately [DATE] 11/10/2010.

## 2011-07-15 LAB — BASIC METABOLIC PANEL
CO2: 25
Calcium: 9.5
Creatinine, Ser: 0.89
GFR calc non Af Amer: >60
Glucose, Bld: 97
Sodium: 135

## 2011-07-15 LAB — POCT CARDIAC MARKERS
CKMB, poc: 1.1
Myoglobin, poc: 67.1
Operator id: 213931

## 2011-07-15 LAB — DIFFERENTIAL
Basophils Absolute: 0.1
Basophils Relative: 1
Lymphocytes Relative: 31
Monocytes Absolute: 0.6
Neutro Abs: 8.1 — ABNORMAL HIGH
Neutrophils Relative %: 62

## 2011-07-15 LAB — CBC
Hemoglobin: 14.8
MCHC: 34.6
Platelets: 240
RDW: 13.9

## 2011-07-15 LAB — D-DIMER, QUANTITATIVE: D-Dimer, Quant: 0.22

## 2011-07-22 LAB — URINALYSIS, ROUTINE W REFLEX MICROSCOPIC
Glucose, UA: NEGATIVE
Ketones, ur: NEGATIVE
Nitrite: NEGATIVE
Specific Gravity, Urine: 1.015
pH: 6

## 2011-07-22 LAB — CBC
HCT: 44.5
MCV: 99.3
Platelets: 200
RBC: 4.48
WBC: 10.5

## 2011-07-22 LAB — DIFFERENTIAL
Lymphocytes Relative: 20
Lymphs Abs: 2.1
Monocytes Relative: 3
Neutrophils Relative %: 77

## 2011-07-22 LAB — BASIC METABOLIC PANEL
BUN: 6
Chloride: 108
GFR calc Af Amer: >60
GFR calc non Af Amer: >60
Potassium: 4.3

## 2011-07-22 LAB — PREGNANCY, URINE: Preg Test, Ur: NEGATIVE

## 2011-11-11 ENCOUNTER — Emergency Department (HOSPITAL_COMMUNITY)
Admission: EM | Admit: 2011-11-11 | Discharge: 2011-11-11 | Disposition: A | Discharge status: Home / Self Care | Payer: Self-pay | Attending: Emergency Medicine | Admitting: Emergency Medicine

## 2011-11-11 ENCOUNTER — Encounter (HOSPITAL_COMMUNITY): Payer: Self-pay

## 2011-11-11 DIAGNOSIS — F319 Bipolar disorder, unspecified: Secondary | ICD-10-CM | POA: Insufficient documentation

## 2011-11-11 DIAGNOSIS — R229 Localized swelling, mass and lump, unspecified: Secondary | ICD-10-CM | POA: Insufficient documentation

## 2011-11-11 DIAGNOSIS — F17200 Nicotine dependence, unspecified, uncomplicated: Secondary | ICD-10-CM | POA: Insufficient documentation

## 2011-11-11 DIAGNOSIS — M79609 Pain in unspecified limb: Secondary | ICD-10-CM | POA: Insufficient documentation

## 2011-11-11 DIAGNOSIS — R2240 Localized swelling, mass and lump, unspecified lower limb: Secondary | ICD-10-CM

## 2011-11-11 DIAGNOSIS — I10 Essential (primary) hypertension: Secondary | ICD-10-CM | POA: Insufficient documentation

## 2011-11-11 DIAGNOSIS — F209 Schizophrenia, unspecified: Secondary | ICD-10-CM | POA: Insufficient documentation

## 2011-11-11 DIAGNOSIS — J45909 Unspecified asthma, uncomplicated: Secondary | ICD-10-CM | POA: Insufficient documentation

## 2011-11-11 HISTORY — DX: Major depressive disorder, single episode, unspecified: F32.9

## 2011-11-11 HISTORY — DX: Bipolar disorder, unspecified: F31.9

## 2011-11-11 HISTORY — DX: Depression, unspecified: F32.A

## 2011-11-11 HISTORY — DX: Essential (primary) hypertension: I10

## 2011-11-11 HISTORY — DX: Schizophrenia, unspecified: F20.9

## 2011-11-11 HISTORY — DX: Unspecified viral hepatitis B without hepatic coma: B19.10

## 2011-11-11 MED ORDER — OXYCODONE HCL 5 MG PO TABS: 5.0000 mg | ORAL_TABLET | ORAL | Status: AC | PRN

## 2011-11-11 MED ORDER — TRAMADOL HCL 50 MG PO TABS
50.0000 mg | ORAL_TABLET | Freq: Once | ORAL | Status: AC
  Administered 2011-11-11: 50 mg via ORAL
  Filled 2011-11-11: qty 1

## 2011-11-11 NOTE — ED Notes (Signed)
Pt presents with pain and "bump" to R foot "for years".  Pt reports similar area to L foot as well, reports pain when she bears weight and at rest, reports pain has worsened, reports "it feels like bone on bone when I walk".

## 2011-11-11 NOTE — ED Provider Notes (Signed)
History     CSN: [id number]  Arrival date & time 11/11/11  1553   First MD Initiated Contact with Patient 11/11/11 1647      Chief Complaint  Patient presents with  . Foot Pain    (Consider location/radiation/quality/duration/timing/severity/associated sxs/prior treatment) HPI Comments: Patient complains of bilateral dorsal foot pain.  She notes that the right foot is worse than the left foot.  She notes she has a cyst or a bump on the more proximal part of both feet.  They have both been present for approximately 20 years.  Patient has no fevers.  She's been able to ambulate normally.  She has no redness or crepitus.  She states that she has not had a physician evaluate this before but comes in today because the pain is been gradually getting worse to the point that she now can't stand it.  It is worse with walking and standing.  It is also worse with plantar flexion of the foot  Patient is a 36 y.o. female presenting with lower extremity pain. The history is provided by the patient. No language interpreter was used.  Foot Pain This is a chronic problem. The current episode started more than 1 week ago. The problem occurs constantly. The problem has been gradually worsening. Pertinent negatives include no chest pain, no abdominal pain, no headaches and no shortness of breath. The symptoms are aggravated by standing. The symptoms are relieved by nothing.    Past Medical History  Diagnosis Date  . Hypertension   . Asthma   . Hepatitis B infection   . Depression   . Bipolar 1 disorder   . Schizophrenia   . Ectopic pregnancy     History reviewed. No pertinent past surgical history.  No family history on file.  History  Substance Use Topics  . Smoking status: Current Everyday Smoker -- 0.5 packs/day  . Smokeless tobacco: Not on file  . Alcohol Use: Yes    OB History    Grav Para Term Preterm Abortions TAB SAB Ect Mult Living                  Review of Systems    Constitutional: Negative.  Negative for fever and chills.  HENT: Negative.   Eyes: Negative.  Negative for discharge and redness.  Respiratory: Negative.  Negative for cough and shortness of breath.   Cardiovascular: Negative.  Negative for chest pain.  Gastrointestinal: Negative.  Negative for nausea, vomiting, abdominal pain and diarrhea.  Genitourinary: Negative.  Negative for dysuria and vaginal discharge.  Musculoskeletal: Negative for back pain.  Skin: Negative.  Negative for color change and rash.  Neurological: Negative.  Negative for syncope and headaches.  Hematological: Negative.  Negative for adenopathy.  Psychiatric/Behavioral: Negative.  Negative for confusion.  All other systems reviewed and are negative.    Allergies  Tylenol  Home Medications   Current Outpatient Rx  Name Route Sig Dispense Refill  . FLUOXETINE HCL 20 MG PO TABS Oral Take 20 mg by mouth 2 (two) times daily.    . TRAZODONE HCL 100 MG PO TABS Oral Take 100 mg by mouth at bedtime.      BP 134/101  Pulse 74  Temp(Src) 97.8 F (36.6 C) (Oral)  Resp 18  SpO2 98%  Physical Exam  Nursing note and vitals reviewed. Constitutional: She is oriented to person, place, and time. She appears well-developed and well-nourished.  Non-toxic appearance. She does not have a sickly appearance.  HENT:  Head: Normocephalic and atraumatic.  Eyes: Conjunctivae, EOM and lids are normal. Pupils are equal, round, and reactive to light. No scleral icterus.  Neck: Trachea normal and normal range of motion. Neck supple.  Cardiovascular: Normal rate.   Pulmonary/Chest: Effort normal.  Abdominal: Soft. Normal appearance. There is no CVA tenderness.  Musculoskeletal: Normal range of motion.       And the dorsum of both feet there are a cystic mass palpable on the proximal aspect.  No erythema, crepitus or warmth.  There are tender to palpation.  The area on the right foot is more tender than on the left.  Patient is able  to flex and extend her ankles.  Capillary refill less than 2 seconds.  Patient has sensation intact to  Neurological: She is alert and oriented to person, place, and time. She has normal strength.  Skin: Skin is warm, dry and intact. No rash noted.  Psychiatric: She has a normal mood and affect. Her behavior is normal. Judgment and thought content normal.    ED Course  Procedures (including critical care time)  Labs Reviewed - No data to display No results found.   No diagnosis found.    MDM  Patient with cyst to bilateral dorsal feet that are potentially ganglion cyst but it is unclear to me at this point in time.  She does not appear to have acute infection.  She has no trauma to suggest that she has broken bones.  For this patient to orthopedics for further evaluation and followup.        Nat Christen, MD 11/11/11 [id number]

## 2011-11-11 NOTE — ED Notes (Signed)
Pt to ED for eval of bil foot pain, R worse than L ; pt reports that she has a bump on each foot that she has had for 27 years; pt reports that she has started to have pain for past 10 years; pt reports that recently, pt's pain has gotten worse to where she is having pain with walking, with numbness/tingling up to knee

## 2011-11-11 NOTE — ED Notes (Signed)
Pt given resources for PCP

## 2012-11-16 ENCOUNTER — Encounter (HOSPITAL_COMMUNITY): Payer: Self-pay | Admitting: *Deleted

## 2012-11-16 ENCOUNTER — Emergency Department (HOSPITAL_COMMUNITY)
Admission: EM | Admit: 2012-11-16 | Discharge: 2012-11-17 | Disposition: A | Discharge status: Home / Self Care | Payer: Medicare Other | Attending: Emergency Medicine | Admitting: Emergency Medicine

## 2012-11-16 DIAGNOSIS — I10 Essential (primary) hypertension: Secondary | ICD-10-CM | POA: Diagnosis not present

## 2012-11-16 DIAGNOSIS — F17200 Nicotine dependence, unspecified, uncomplicated: Secondary | ICD-10-CM | POA: Diagnosis not present

## 2012-11-16 DIAGNOSIS — Z3202 Encounter for pregnancy test, result negative: Secondary | ICD-10-CM | POA: Diagnosis not present

## 2012-11-16 DIAGNOSIS — F411 Generalized anxiety disorder: Secondary | ICD-10-CM | POA: Diagnosis not present

## 2012-11-16 DIAGNOSIS — F319 Bipolar disorder, unspecified: Secondary | ICD-10-CM | POA: Insufficient documentation

## 2012-11-16 DIAGNOSIS — J45909 Unspecified asthma, uncomplicated: Secondary | ICD-10-CM | POA: Insufficient documentation

## 2012-11-16 DIAGNOSIS — R45850 Homicidal ideations: Secondary | ICD-10-CM | POA: Diagnosis not present

## 2012-11-16 DIAGNOSIS — Z79899 Other long term (current) drug therapy: Secondary | ICD-10-CM | POA: Insufficient documentation

## 2012-11-16 DIAGNOSIS — Z8619 Personal history of other infectious and parasitic diseases: Secondary | ICD-10-CM | POA: Insufficient documentation

## 2012-11-16 DIAGNOSIS — F209 Schizophrenia, unspecified: Secondary | ICD-10-CM | POA: Insufficient documentation

## 2012-11-16 DIAGNOSIS — F419 Anxiety disorder, unspecified: Secondary | ICD-10-CM

## 2012-11-16 LAB — COMPREHENSIVE METABOLIC PANEL
ALT: 9 U/L (ref 0–35)
AST: 18 U/L (ref 0–37)
Albumin: 3.8 g/dL (ref 3.5–5.2)
Alkaline Phosphatase: 65 U/L (ref 39–117)
BUN: 6 mg/dL (ref 6–23)
CO2: 22 mEq/L (ref 19–32)
Calcium: 9.4 mg/dL (ref 8.4–10.5)
Chloride: 100 mEq/L (ref 96–112)
Creatinine, Ser: 0.7 mg/dL (ref 0.50–1.10)
GFR calc Af Amer: >90 mL/min (ref >90)
GFR calc non Af Amer: >90 mL/min (ref >90)
Glucose, Bld: 96 mg/dL (ref 70–99)
Potassium: 3.7 mEq/L (ref 3.5–5.1)
Sodium: 136 mEq/L (ref 135–145)
Total Bilirubin: 0.4 mg/dL (ref 0.3–1.2)
Total Protein: 7.2 g/dL (ref 6.0–8.3)

## 2012-11-16 LAB — CBC WITH DIFFERENTIAL/PLATELET
Basophils Absolute: 0 10*3/uL (ref 0.0–0.1)
Basophils Relative: 0 % (ref 0–1)
Eosinophils Absolute: 0.1 10*3/uL (ref 0.0–0.7)
Lymphs Abs: 3.2 10*3/uL (ref 0.7–4.0)
MCH: 32.9 pg (ref 26.0–34.0)
MCHC: 34.2 g/dL (ref 30.0–36.0)
Neutrophils Relative %: 69 % (ref 43–77)
Platelets: 246 10*3/uL (ref 150–400)
RBC: 4.59 MIL/uL (ref 3.87–5.11)
RDW: 13.5 % (ref 11.5–15.5)

## 2012-11-16 LAB — URINE MICROSCOPIC-ADD ON

## 2012-11-16 LAB — RAPID URINE DRUG SCREEN, HOSP PERFORMED
Barbiturates: NOT DETECTED
Benzodiazepines: NOT DETECTED

## 2012-11-16 LAB — ETHANOL: Alcohol, Ethyl (B): <11 mg/dL (ref 0–11)

## 2012-11-16 LAB — URINALYSIS, ROUTINE W REFLEX MICROSCOPIC
Bilirubin Urine: NEGATIVE
Nitrite: NEGATIVE
Specific Gravity, Urine: 1.011 (ref 1.005–1.030)
Urobilinogen, UA: 1 mg/dL (ref 0.0–1.0)

## 2012-11-16 LAB — POCT PREGNANCY, URINE: Preg Test, Ur: NEGATIVE

## 2012-11-16 MED ORDER — NICOTINE 21 MG/24HR TD PT24: 21.0000 mg | MEDICATED_PATCH | Freq: Every day | TRANSDERMAL | Status: DC

## 2012-11-16 MED ORDER — LISINOPRIL 10 MG PO TABS
10.0000 mg | ORAL_TABLET | Freq: Every day | ORAL | Status: DC
  Administered 2012-11-16: 10 mg via ORAL
  Filled 2012-11-16 (×2): qty 1

## 2012-11-16 MED ORDER — CEPHALEXIN 500 MG PO CAPS
500.0000 mg | ORAL_CAPSULE | Freq: Two times a day (BID) | ORAL | Status: DC
  Administered 2012-11-16: 500 mg via ORAL
  Filled 2012-11-16: qty 1

## 2012-11-16 MED ORDER — ONDANSETRON HCL 4 MG PO TABS: 4.0000 mg | ORAL_TABLET | Freq: Three times a day (TID) | ORAL | Status: DC | PRN

## 2012-11-16 MED ORDER — LORAZEPAM 1 MG PO TABS: 1.0000 mg | ORAL_TABLET | Freq: Three times a day (TID) | ORAL | Status: DC | PRN

## 2012-11-16 MED ORDER — TRAZODONE HCL 100 MG PO TABS
200.0000 mg | ORAL_TABLET | Freq: Every day | ORAL | Status: DC
  Administered 2012-11-16: 200 mg via ORAL
  Filled 2012-11-16: qty 2

## 2012-11-16 MED ORDER — IBUPROFEN 200 MG PO TABS: 600.0000 mg | ORAL_TABLET | Freq: Three times a day (TID) | ORAL | Status: DC | PRN

## 2012-11-16 MED ORDER — MELOXICAM 15 MG PO TABS
15.0000 mg | ORAL_TABLET | Freq: Every day | ORAL | Status: DC
  Administered 2012-11-16: 15 mg via ORAL
  Filled 2012-11-16 (×2): qty 1

## 2012-11-16 MED ORDER — ALUM & MAG HYDROXIDE-SIMETH 200-200-20 MG/5ML PO SUSP: 30.0000 mL | ORAL | Status: DC | PRN

## 2012-11-16 MED ORDER — FLUOXETINE HCL 20 MG PO TABS
20.0000 mg | ORAL_TABLET | Freq: Two times a day (BID) | ORAL | Status: DC
  Administered 2012-11-16: 20 mg via ORAL
  Filled 2012-11-16 (×3): qty 1

## 2012-11-16 MED ORDER — HALOPERIDOL LACTATE 5 MG/ML IJ SOLN
5.0000 mg | Freq: Once | INTRAMUSCULAR | Status: AC
  Administered 2012-11-16: 5 mg via INTRAMUSCULAR
  Filled 2012-11-16: qty 1

## 2012-11-16 MED ORDER — ZOLPIDEM TARTRATE 5 MG PO TABS: 5.0000 mg | ORAL_TABLET | Freq: Every evening | ORAL | Status: DC | PRN

## 2012-11-16 NOTE — ED Notes (Signed)
Attempted to call report to TCU, per RN there are already five patients in TCU and she can't have any more at this time.

## 2012-11-16 NOTE — ED Notes (Signed)
Pt presents to ed with c/o "panic attack" Pt sts went to urgent care for a physical exam for disability, when she started hearing voices. Pt sts she hears voices often but they are "never bad". Pt sts this time voices are repeating "burn". Pt appears very anxious, is tearful and sts she feels very scared. Pt's eyes seem focused on the floor next to the sink; when asked what is she looking at pt sts "there is a small child sitting there." while this writer is typing pt is quietly repeating "go home". Pt asked this Clinical research associate to call her fiance Fayrene Fearing and ask him to come.

## 2012-11-16 NOTE — ED Notes (Signed)
Pt reports she has been hearing voices saying burn.  Admits to psych hx.  Denies SI or HI. Pt calm & cooperative at present.  Stating she wants to go home.

## 2012-11-16 NOTE — ED Notes (Signed)
Per EMs report: pt coming from [facility] urgent care with c/o panic attack and  hearing voices. Per EMS pt has hx of schizophrenia.

## 2012-11-16 NOTE — ED Notes (Signed)
ZOX:[id number]<AV> Expected date:<BR> Expected time:<BR> Means of arrival:<BR> Comments:<BR> Difficulty breathing/hearing voices/psych eval
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
## 2012-11-16 NOTE — ED Notes (Signed)
I placed the patients bra, shirt, sweat shirt, and boots into a patient belonging bag. Also sitting in the chair is her pocket book, and coat and an envelope.

## 2012-11-16 NOTE — ED Notes (Signed)
Received call from lab, CBC sample clotted need to recollect.
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
## 2012-11-17 NOTE — ED Provider Notes (Signed)
[name] is a 37 y.o. female originally presented to the ER complaining of panic attacks and auditory hallucinations were somebody would repeatedly say the word "burning." Patient says she's not having these auditory hallucinations anymore, she no longer feels anxious or is having panic attacks. She says he feels much better.   Psychiatry recommends okay to discharge with followup with therapist - patient sees Dr. Lelon Perla who comes her home by weekly and has counselor come to her house weekly as well. She denies any delusions or hallucinations at this time, she denies any suicidal homicidal ideations, and agree with the psychiatrist evaluation we'll discharge the patient home stable and good condition.  Jones Skene, MD 11/17/12 [phone]

## 2012-11-17 NOTE — ED Provider Notes (Signed)
Medical screening examination/treatment/procedure(s) were performed by non-physician practitioner and as supervising physician I was immediately available for consultation/collaboration.  Owyn Raulston R. Lindee Leason, MD 11/17/12 0651 

## 2012-11-18 LAB — URINE CULTURE
Colony Count: NO GROWTH
Culture: NO GROWTH

## 2013-03-13 ENCOUNTER — Encounter (HOSPITAL_COMMUNITY): Payer: Self-pay | Admitting: *Deleted

## 2013-03-13 DIAGNOSIS — F17200 Nicotine dependence, unspecified, uncomplicated: Secondary | ICD-10-CM | POA: Diagnosis not present

## 2013-03-13 DIAGNOSIS — F209 Schizophrenia, unspecified: Secondary | ICD-10-CM | POA: Insufficient documentation

## 2013-03-13 DIAGNOSIS — Z8619 Personal history of other infectious and parasitic diseases: Secondary | ICD-10-CM | POA: Insufficient documentation

## 2013-03-13 DIAGNOSIS — B3789 Other sites of candidiasis: Secondary | ICD-10-CM | POA: Insufficient documentation

## 2013-03-13 DIAGNOSIS — N644 Mastodynia: Secondary | ICD-10-CM | POA: Diagnosis not present

## 2013-03-13 DIAGNOSIS — I10 Essential (primary) hypertension: Secondary | ICD-10-CM | POA: Insufficient documentation

## 2013-03-13 DIAGNOSIS — Z8742 Personal history of other diseases of the female genital tract: Secondary | ICD-10-CM | POA: Insufficient documentation

## 2013-03-13 DIAGNOSIS — F319 Bipolar disorder, unspecified: Secondary | ICD-10-CM | POA: Insufficient documentation

## 2013-03-13 DIAGNOSIS — Z79899 Other long term (current) drug therapy: Secondary | ICD-10-CM | POA: Diagnosis not present

## 2013-03-13 DIAGNOSIS — J45909 Unspecified asthma, uncomplicated: Secondary | ICD-10-CM | POA: Insufficient documentation

## 2013-03-13 NOTE — ED Notes (Addendum)
Large skin break down on rt. Side - under the dorsal side of breast and upper rt. Abd., extending to rt. Lower mid abd. Purulent drainage. No necrosis. Fowl smell.

## 2013-03-14 ENCOUNTER — Emergency Department (HOSPITAL_COMMUNITY)
Admission: EM | Admit: 2013-03-14 | Discharge: 2013-03-14 | Disposition: A | Discharge status: Home / Self Care | Payer: Medicare Other | Attending: Emergency Medicine | Admitting: Emergency Medicine

## 2013-03-14 DIAGNOSIS — B372 Candidiasis of skin and nail: Secondary | ICD-10-CM

## 2013-03-14 MED ORDER — NYSTATIN 100000 UNIT/GM EX POWD: Freq: Four times a day (QID) | CUTANEOUS | Status: DC

## 2013-03-14 NOTE — ED Provider Notes (Signed)
History     CSN: [id number]  Arrival date & time 03/13/13  2218   First MD Initiated Contact with Patient 03/14/13 0045      Chief Complaint  Patient presents with  . Breast Pain  . Skin Problem    (Consider location/radiation/quality/duration/timing/severity/associated sxs/prior treatment) HPI Comments: Patient with rash under both breasts for the past week and getting worse.  No fevers or chills.  Patient is a 37 y.o. female presenting with rash. The history is provided by the patient.  Rash Pain location: under both breasts. Pain quality: burning   Pain radiates to:  Does not radiate Pain severity:  Moderate Onset quality:  Gradual Duration:  1 week Timing:  Constant Progression:  Worsening Chronicity:  New Relieved by:  Nothing Worsened by:  Nothing tried Ineffective treatments:  None tried   Past Medical History  Diagnosis Date  . Hypertension   . Asthma   . Hepatitis B infection   . Depression   . Bipolar 1 disorder   . Schizophrenia   . Ectopic pregnancy     History reviewed. No pertinent past surgical history.  No family history on file.  History  Substance Use Topics  . Smoking status: Current Every Day Smoker -- 0.50 packs/day  . Smokeless tobacco: Not on file  . Alcohol Use: Yes    OB History   Grav Para Term Preterm Abortions TAB SAB Ect Mult Living                  Review of Systems  Skin: Positive for rash.  All other systems reviewed and are negative.    Allergies  Tylenol  Home Medications   Current Outpatient Rx  Name  Route  Sig  Dispense  Refill  . diphenhydrAMINE (SOMINEX) 25 MG tablet   Oral   Take 25 mg by mouth 2 (two) times daily.         Marland Kitchen FLUoxetine (PROZAC) 20 MG tablet   Oral   Take 20 mg by mouth 2 (two) times daily.         Marland Kitchen lisinopril (PRINIVIL,ZESTRIL) 10 MG tablet   Oral   Take 10 mg by mouth daily.         . meloxicam (MOBIC) 15 MG tablet   Oral   Take 15 mg by mouth daily.         .  traMADol (ULTRAM) 50 MG tablet   Oral   Take 50 mg by mouth every 6 (six) hours as needed. pain         . traZODone (DESYREL) 100 MG tablet   Oral   Take 200 mg by mouth at bedtime.            BP 151/88  Pulse 95  Temp(Src) 98.5 F (36.9 C) (Oral)  Resp 18  SpO2 96%  Physical Exam  Nursing note and vitals reviewed. Constitutional: She is oriented to person, place, and time. She appears well-developed and well-nourished. No distress.  HENT:  Head: Normocephalic and atraumatic.  Mouth/Throat: Oropharynx is clear and moist.  Neck: Normal range of motion. Neck supple.  Musculoskeletal: Normal range of motion. She exhibits no edema.  Neurological: She is alert and oriented to person, place, and time.  Skin: Skin is warm and dry. She is not diaphoretic.  There is an area of skin breakdown and erythema under both breasts, the right greater than left.      ED Course  Procedures (including critical care time)  Labs Reviewed - No data to display No results found.   No diagnosis found.    MDM  Looks candidal.  Will treat with nystatin powder.  Advised to keep clean and dry.        Geoffery Lyons, MD 03/14/13 [phone]

## 2013-06-07 ENCOUNTER — Encounter: Payer: Self-pay | Admitting: Obstetrics & Gynecology

## 2013-06-12 ENCOUNTER — Encounter: Payer: Self-pay | Admitting: Advanced Practice Midwife

## 2013-06-12 ENCOUNTER — Ambulatory Visit (INDEPENDENT_AMBULATORY_CARE_PROVIDER_SITE_OTHER): Payer: Medicare Other | Admitting: Advanced Practice Midwife

## 2013-06-12 VITALS — BP 101/74 | HR 80 | Temp 97.9°F | Ht 64.0 in | Wt 204.2 lb

## 2013-06-12 DIAGNOSIS — N912 Amenorrhea, unspecified: Secondary | ICD-10-CM | POA: Insufficient documentation

## 2013-06-12 DIAGNOSIS — Z7289 Other problems related to lifestyle: Secondary | ICD-10-CM

## 2013-06-12 DIAGNOSIS — Z01419 Encounter for gynecological examination (general) (routine) without abnormal findings: Secondary | ICD-10-CM

## 2013-06-12 DIAGNOSIS — N760 Acute vaginitis: Secondary | ICD-10-CM

## 2013-06-12 DIAGNOSIS — Z1159 Encounter for screening for other viral diseases: Secondary | ICD-10-CM

## 2013-06-12 DIAGNOSIS — R369 Urethral discharge, unspecified: Secondary | ICD-10-CM

## 2013-06-12 DIAGNOSIS — Z113 Encounter for screening for infections with a predominantly sexual mode of transmission: Secondary | ICD-10-CM

## 2013-06-12 DIAGNOSIS — F209 Schizophrenia, unspecified: Secondary | ICD-10-CM

## 2013-06-12 DIAGNOSIS — E669 Obesity, unspecified: Secondary | ICD-10-CM

## 2013-06-12 DIAGNOSIS — R894 Abnormal immunological findings in specimens from other organs, systems and tissues: Secondary | ICD-10-CM

## 2013-06-12 DIAGNOSIS — R768 Other specified abnormal immunological findings in serum: Secondary | ICD-10-CM

## 2013-06-12 LAB — POCT URINALYSIS DIPSTICK
Bilirubin, UA: NEGATIVE
Blood, UA: NEGATIVE
Spec Grav, UA: 1.025
pH, UA: 5

## 2013-06-12 NOTE — Progress Notes (Signed)
.   Subjective:     [name] is a 37 y.o. female here for a routine exam.  No current complaints.  Personal health questionnaire reviewed: yes.  Patient denies any history of having LARC. Uncertain as to amenorrhea.   Having unprotected intercourse.    Gynecologic History No LMP recorded. Patient is not currently having periods (Reason: Other). Pt states that she has not had a cycle since her ectopic pregnancy in 2011. Contraception: none Last Pap: couple of years ago. Results were: normal Last mammogram: N/A  Obstetric History OB History  No data available     The following portions of the patient's history were reviewed and updated as appropriate: allergies, current medications, past family history, past medical history, past social history, past surgical history and problem list.  Review of Systems A comprehensive review of systems was negative.    Objective:    BP 101/74  Pulse 80  Temp(Src) 97.9 F (36.6 C) (Oral)  Ht 5\' 4"  (1.626 m)  Wt 204 lb 3.2 oz (92.625 kg)  BMI 35.03 kg/m2  General Appearance:    Alert, cooperative, no distress, appears stated age  Head:    Normocephalic, without obvious abnormality, atraumatic  Eyes:    PERRL, conjunctiva/corneas clear, EOM's intact, fundi    benign, both eyes  Ears:    Normal TM's and external ear canals, both ears  Nose:   Nares normal, septum midline, mucosa normal, no drainage    or sinus tenderness  Throat:   Lips, mucosa, and tongue normal; teeth and gums normal  Neck:   Supple, symmetrical, trachea midline, no adenopathy;    thyroid:  no enlargement/tenderness/nodules; no carotid   bruit or JVD  Back:     Symmetric, no curvature, ROM normal, no CVA tenderness  Lungs:     Clear to auscultation bilaterally, respirations unlabored  Chest Wall:    No tenderness or deformity   Heart:    Regular rate and rhythm, S1 and S2 normal, no murmur, rub   or gallop  Breast Exam:    No tenderness, masses, or nipple  abnormality  Abdomen:     Soft, non-tender, bowel sounds active all four quadrants,    no masses, no organomegaly  Genitalia:    Green discharge from Cervix, negative CMT, Normal female without lesion, or tenderness  Rectal:    Normal tone, normal prostate, no masses or tenderness;   guaiac negative stool  Extremities:   Extremities normal, atraumatic, no cyanosis or edema  Pulses:   2+ and symmetric all extremities  Skin:   Skin color, texture, turgor normal, no rashes or lesions  Lymph nodes:   Cervical, supraclavicular, and axillary nodes normal  Neurologic:   CNII-XII intact, normal strength, sensation and reflexes    throughout      Assessment:     Patient Active Problem List   Diagnosis Date Noted  . Amenorrhea 06/12/2013  . Well woman exam with routine gynecological exam 06/12/2013  . Obesity 06/12/2013  . Schizophrenia 06/12/2013    Plan:    Education reviewed: calcium supplements, depression evaluation, low fat, low cholesterol diet, safe sex/STD prevention and self breast exams. Follow up in: PRN .Marland Kitchen   GC/CT STI screening today TSH pending  Hedaya Latendresse Wilson Singer CNM

## 2013-06-13 LAB — PAP IG W/ RFLX HPV ASCU

## 2013-06-13 LAB — HEPATITIS C ANTIBODY: HCV Ab: NEGATIVE

## 2013-06-13 LAB — WET PREP BY MOLECULAR PROBE: Candida species: NEGATIVE

## 2013-06-13 LAB — RPR

## 2013-06-13 LAB — GC/CHLAMYDIA PROBE AMP: GC Probe RNA: NEGATIVE

## 2013-07-02 ENCOUNTER — Ambulatory Visit (INDEPENDENT_AMBULATORY_CARE_PROVIDER_SITE_OTHER): Payer: Medicare Other | Admitting: Diagnostic Neuroimaging

## 2013-07-02 ENCOUNTER — Encounter: Payer: Self-pay | Admitting: Diagnostic Neuroimaging

## 2013-07-02 VITALS — BP 92/67 | HR 79 | Temp 98.0°F | Ht 63.0 in | Wt 202.0 lb

## 2013-07-02 DIAGNOSIS — F259 Schizoaffective disorder, unspecified: Secondary | ICD-10-CM

## 2013-07-02 DIAGNOSIS — G3184 Mild cognitive impairment, so stated: Secondary | ICD-10-CM

## 2013-07-02 NOTE — Progress Notes (Signed)
[facility]  PATIENT: [name] DOB: 1976/02/08  REFERRING CLINICIAN: Avbuere HISTORY FROM: patient and ACTT team member REASON FOR VISIT: new consult   HISTORICAL  CHIEF COMPLAINT:  Chief Complaint  Patient presents with  . Neurologic Problem    cognitive defcits    HISTORY OF PRESENT ILLNESS:   37 year old right-handed female with history of hepatitis B, hypertension, schizoaffective disorder, here for evaluation of cognitive slowing.  For past one year patient has had progressive psychomotor slowing, malaise, fatigue, cognitive difficulty. Patient does not feel like she is significantly worsened. She is on Prozac, Benadryl, Haldol for many years.  Patient tells me she graduated from high school, getting A's and B's in school. She denies any academic difficulty, developmental delay or special education needs.  REVIEW OF SYSTEMS: Full 14 system review of systems performed and notable only for depression anxiety none of sleep decreased energy disinterest in activities change in appetite increased thirst.  ALLERGIES: Allergies  Allergen Reactions  . Tylenol [Acetaminophen] Other (See Comments)    Patient is a hep B patient and is not allowed tylenol    HOME MEDICATIONS: Prior to Admission medications   Medication Sig Start Date End Date Taking? Authorizing Provider  diphenhydrAMINE (SOMINEX) 25 MG tablet Take 25 mg by mouth 2 (two) times daily.   Yes Historical Provider, MD  FLUoxetine (PROZAC) 20 MG tablet Take 20 mg by mouth 2 (two) times daily.   Yes Historical Provider, MD  haloperidol (HALDOL) 10 MG tablet Take 10 mg by mouth every evening.   Yes Historical Provider, MD  lisinopril (PRINIVIL,ZESTRIL) 10 MG tablet Take 10 mg by mouth daily.   Yes Historical Provider, MD  lisinopril-hydrochlorothiazide (PRINZIDE,ZESTORETIC) 20-12.5 MG per tablet Take 1 tablet by mouth daily.   Yes Historical Provider, MD  meloxicam (MOBIC) 15 MG tablet Take  15 mg by mouth daily.   Yes Historical Provider, MD  traMADol (ULTRAM) 50 MG tablet Take 50 mg by mouth every 6 (six) hours as needed. pain   Yes Historical Provider, MD   Outpatient Prescriptions Prior to Visit  Medication Sig Dispense Refill  . diphenhydrAMINE (SOMINEX) 25 MG tablet Take 25 mg by mouth 2 (two) times daily.      Marland Kitchen FLUoxetine (PROZAC) 20 MG tablet Take 20 mg by mouth 2 (two) times daily.      . haloperidol (HALDOL) 10 MG tablet Take 10 mg by mouth every evening.      Marland Kitchen lisinopril (PRINIVIL,ZESTRIL) 10 MG tablet Take 10 mg by mouth daily.      Marland Kitchen lisinopril-hydrochlorothiazide (PRINZIDE,ZESTORETIC) 20-12.5 MG per tablet Take 1 tablet by mouth daily.      . meloxicam (MOBIC) 15 MG tablet Take 15 mg by mouth daily.      . traMADol (ULTRAM) 50 MG tablet Take 50 mg by mouth every 6 (six) hours as needed. pain      . nystatin (MYCOSTATIN) powder Apply topically 4 (four) times daily.  30 g  1  . traZODone (DESYREL) 100 MG tablet Take 200 mg by mouth at bedtime.        No facility-administered medications prior to visit.    PAST MEDICAL HISTORY: Past Medical History  Diagnosis Date  . Hypertension   . Asthma   . Hepatitis B infection   . Depression   . Bipolar 1 disorder   . Schizophrenia   . Ectopic pregnancy     PAST SURGICAL HISTORY: Past Surgical History  Procedure Laterality Date  .  Diagnostic laparoscopy with removal of ectopic pregnancy      FAMILY HISTORY: Family History  Problem Relation Age of Onset  . Heart disease Mother   . Stroke Mother   . Asthma Mother   . Hypertension Father     SOCIAL HISTORY:  History   Social History  . Marital Status: Single    Spouse Name: N/A    Number of Children: 0  . Years of Education: HS   Occupational History  . DISABLE    Social History Main Topics  . Smoking status: Current Every Day Smoker -- 0.50 packs/day    Types: Cigarettes  . Smokeless tobacco: Never Used  . Alcohol Use: No  . Drug Use: No  .  Sexual Activity: Yes   Other Topics Concern  . Not on file   Social History Narrative   Patient lives at home with her boyfriend.   Caffeine Use: 1 cup daily     PHYSICAL EXAM  Filed Vitals:   07/02/13 1116  BP: 92/67  Pulse: 79  Temp: 98 F (36.7 C)  TempSrc: Oral  Height: 5\' 3"  (1.6 m)  Weight: 202 lb (91.627 kg)    Not recorded    Body mass index is 35.79 kg/(m^2).  GENERAL EXAM: Patient is in no distress; SLOW TO RESPOND, RESTLESS, ROCKING LEGS.   CARDIOVASCULAR: Regular rate and rhythm, no murmurs, no carotid bruits  NEUROLOGIC: MENTAL STATUS: awake, alert, language fluent, comprehension intact, naming intact CRANIAL NERVE: no papilledema on fundoscopic exam, pupils equal and reactive to light, visual fields full to confrontation, extraocular muscles intact, no nystagmus, facial sensation and strength symmetric, uvula midline, shoulder shrug symmetric, tongue midline. MOTOR: normal bulk and tone, full strength in the BUE, BLE SENSORY: normal and symmetric to light touch, pinprick, temperature, vibration COORDINATION: finger-nose-finger, fine finger movements normal REFLEXES: deep tendon reflexes present and symmetric GAIT/STATION: narrow based gait   DIAGNOSTIC DATA (LABS, IMAGING, TESTING) - I reviewed patient records, labs, notes, testing and imaging myself where available.  Lab Results  Component Value Date   WBC 12.3* 11/16/2012   HGB 15.1* 11/16/2012   HCT 44.2 11/16/2012   MCV 96.3 11/16/2012   PLT 246 11/16/2012      Component Value Date/Time   NA 136 11/16/2012 1440   K 3.7 11/16/2012 1440   CL 100 11/16/2012 1440   CO2 22 11/16/2012 1440   GLUCOSE 96 11/16/2012 1440   BUN 6 11/16/2012 1440   CREATININE 0.70 11/16/2012 1440   CALCIUM 9.4 11/16/2012 1440   PROT 7.2 11/16/2012 1440   ALBUMIN 3.8 11/16/2012 1440   AST 18 11/16/2012 1440   ALT 9 11/16/2012 1440   ALKPHOS 65 11/16/2012 1440   BILITOT 0.4 11/16/2012 1440   GFRNONAA >90 11/16/2012 1440    GFRAA >90 11/16/2012 1440   No results found for this basename: CHOL, HDL, LDLCALC, LDLDIRECT, TRIG, CHOLHDL   No results found for this basename: HGBA1C   No results found for this basename: VITAMINB12   Lab Results  Component Value Date   TSH 0.715 06/12/2013     ASSESSMENT AND PLAN  37 y.o. year old female here with schizoaffective disorder, hepatitis B, hypertension, here with cognitive decline and swelling over the past one year. This could be her related to underlying psychiatric disorders and medication side effects. I will check MRI of the brain to look at other secondary causes.  Orders Placed This Encounter  Procedures  . MR Brain Wo Contrast  Return in about 6 months (around 12/30/2013) for with Heide Guile or Fathima Bartl.    Suanne Marker, MD 07/02/2013, 12:59 PM Certified in Neurology, Neurophysiology and Neuroimaging  [facility] [address] [place], [place] [address] [phone]

## 2013-07-02 NOTE — Patient Instructions (Signed)
I will check MRI brain.  

## 2013-08-23 DIAGNOSIS — I10 Essential (primary) hypertension: Secondary | ICD-10-CM | POA: Diagnosis not present

## 2013-09-04 ENCOUNTER — Ambulatory Visit: Payer: Medicare Other | Admitting: Diagnostic Neuroimaging

## 2013-09-05 ENCOUNTER — Telehealth: Payer: Self-pay | Admitting: Diagnostic Neuroimaging

## 2013-09-05 ENCOUNTER — Encounter: Payer: Self-pay | Admitting: Diagnostic Neuroimaging

## 2013-09-05 ENCOUNTER — Encounter (INDEPENDENT_AMBULATORY_CARE_PROVIDER_SITE_OTHER): Payer: Self-pay

## 2013-09-05 ENCOUNTER — Ambulatory Visit (INDEPENDENT_AMBULATORY_CARE_PROVIDER_SITE_OTHER): Payer: Medicare Other | Admitting: Diagnostic Neuroimaging

## 2013-09-05 VITALS — BP 131/78 | HR 84 | Temp 97.8°F | Ht 63.0 in | Wt 192.0 lb

## 2013-09-05 DIAGNOSIS — G3184 Mild cognitive impairment, so stated: Secondary | ICD-10-CM

## 2013-09-05 DIAGNOSIS — G2571 Drug induced akathisia: Secondary | ICD-10-CM

## 2013-09-05 DIAGNOSIS — F259 Schizoaffective disorder, unspecified: Secondary | ICD-10-CM

## 2013-09-05 DIAGNOSIS — R269 Unspecified abnormalities of gait and mobility: Secondary | ICD-10-CM

## 2013-09-05 DIAGNOSIS — R259 Unspecified abnormal involuntary movements: Secondary | ICD-10-CM

## 2013-09-05 NOTE — Progress Notes (Signed)
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 Follow-up    HISTORY OF PRESENT ILLNESS:   UPDATE 09/05/13: Since last visit patient continues to have problems with cognitive slowing, withdrawal affect, gait and balance difficulty. She has a abnormal involuntary movements in the arms and legs. She's quite restless. She has not had MRI of the brain yet.  PRIOR HPI (07/02/13): 37 year old right-handed female with history of hepatitis B, hypertension, schizoaffective disorder, here for evaluation of cognitive slowing.  For past one year patient has had progressive psychomotor slowing, malaise, fatigue, cognitive difficulty. Patient does not feel like she is significantly worsened. She is on Prozac, Benadryl, Haldol for many years.  Patient tells me she graduated from high school, getting A's and B's in school. She denies any academic difficulty, developmental delay or special education needs.  REVIEW OF SYSTEMS: Full 14 system review of systems performed and notable only for sleepiness restless legs memory loss confusion slurred speech tremor too much sleep decreased energy disinterest in activities cough weight loss fatigue.  ALLERGIES: Allergies  Allergen Reactions  . Tylenol [Acetaminophen] Other (See Comments)    Patient is a hep B patient and is not allowed tylenol    HOME MEDICATIONS: Prior to Admission medications   Medication Sig Start Date End Date Taking? Authorizing Provider  diphenhydrAMINE (SOMINEX) 25 MG tablet Take 25 mg by mouth 2 (two) times daily.   Yes Historical Provider, MD  FLUoxetine (PROZAC) 20 MG tablet Take 20 mg by mouth 2 (two) times daily.   Yes Historical Provider, MD  haloperidol (HALDOL) 10 MG tablet Take 10 mg by mouth every evening.   Yes Historical  Provider, MD  lisinopril (PRINIVIL,ZESTRIL) 10 MG tablet Take 10 mg by mouth daily.   Yes Historical Provider, MD  lisinopril-hydrochlorothiazide (PRINZIDE,ZESTORETIC) 20-12.5 MG per tablet Take 1 tablet by mouth daily.   Yes Historical Provider, MD  meloxicam (MOBIC) 15 MG tablet Take 15 mg by mouth daily.   Yes Historical Provider, MD  traMADol (ULTRAM) 50 MG tablet Take 50 mg by mouth every 6 (six) hours as needed. pain   Yes Historical Provider, MD   Outpatient Prescriptions Prior to Visit  Medication Sig Dispense Refill  . diphenhydrAMINE (SOMINEX) 25 MG tablet Take 25 mg by mouth 2 (two) times daily.      Marland Kitchen FLUoxetine (PROZAC) 20 MG tablet Take 20 mg by mouth 2 (two) times daily.      . haloperidol (HALDOL) 10 MG tablet Take 10 mg by mouth every evening.      Marland Kitchen lisinopril-hydrochlorothiazide (PRINZIDE,ZESTORETIC) 20-12.5 MG per tablet Take 1 tablet by mouth daily.      . meloxicam (MOBIC) 15 MG tablet Take 15 mg by mouth daily.      Marland Kitchen lisinopril (PRINIVIL,ZESTRIL) 10 MG tablet Take 10 mg by mouth daily.      . traMADol (ULTRAM) 50 MG tablet Take 50 mg by mouth every 6 (six) hours as needed. pain       No facility-administered medications prior to visit.    PAST MEDICAL HISTORY: Past Medical History  Diagnosis Date  . Hypertension   . Asthma   . Hepatitis B infection   . Depression   . Bipolar 1 disorder   . Schizophrenia   . Ectopic pregnancy  PAST SURGICAL HISTORY: Past Surgical History  Procedure Laterality Date  . Diagnostic laparoscopy with removal of ectopic pregnancy      FAMILY HISTORY: Family History  Problem Relation Age of Onset  . Heart disease Mother   . Stroke Mother   . Asthma Mother   . Hypertension Father     SOCIAL HISTORY:  History   Social History  . Marital Status: Single    Spouse Name: N/A    Number of Children: 0  . Years of Education: HS   Occupational History  . DISABLE    Social History Main Topics  . Smoking status:  Current Every Day Smoker -- 0.50 packs/day    Types: Cigarettes  . Smokeless tobacco: Never Used  . Alcohol Use: No  . Drug Use: No  . Sexual Activity: Yes   Other Topics Concern  . Not on file   Social History Narrative   Patient lives at home with her boyfriend.   Caffeine Use: 1 cup daily     PHYSICAL EXAM  Filed Vitals:   09/05/13 1134  BP: 131/78  Pulse: 84  Temp: 97.8 F (36.6 C)  TempSrc: Oral  Height: 5\' 3"  (1.6 m)  Weight: 192 lb (87.091 kg)    Not recorded    Body mass index is 34.02 kg/(m^2).  GENERAL EXAM: Patient is in no distress; SLOW TO RESPOND, RESTLESS, ROCKING LEGS.   CARDIOVASCULAR: Regular rate and rhythm, no murmurs, no carotid bruits  NEUROLOGIC: MENTAL STATUS: awake, alert, language fluent, comprehension intact, naming intact CRANIAL NERVE: pupils equal and reactive to light, visual fields full to confrontation, extraocular muscles intact, no nystagmus, facial sensation and strength symmetric, uvula midline, shoulder shrug symmetric, tongue midline. MOTOR: normal bulk and tone, full strength in the BUE, BLE SENSORY: normal and symmetric to light touch, temperature, vibration COORDINATION: finger-nose-finger, fine finger movements normal REFLEXES: deep tendon reflexes TRACE and symmetric GAIT/STATION: narrow based gait; MARCHING TYPE GAIT WITH LEGS STIFF AND EXAGGERATED MOTION. ABLE TO WALK ON TOES, HEEL AND TANDEM.   DIAGNOSTIC DATA (LABS, IMAGING, TESTING) - I reviewed patient records, labs, notes, testing and imaging myself where available.  Lab Results  Component Value Date   WBC 12.3* 11/16/2012   HGB 15.1* 11/16/2012   HCT 44.2 11/16/2012   MCV 96.3 11/16/2012   PLT 246 11/16/2012      Component Value Date/Time   NA 136 11/16/2012 1440   K 3.7 11/16/2012 1440   CL 100 11/16/2012 1440   CO2 22 11/16/2012 1440   GLUCOSE 96 11/16/2012 1440   BUN 6 11/16/2012 1440   CREATININE 0.70 11/16/2012 1440   CALCIUM 9.4 11/16/2012 1440   PROT  7.2 11/16/2012 1440   ALBUMIN 3.8 11/16/2012 1440   AST 18 11/16/2012 1440   ALT 9 11/16/2012 1440   ALKPHOS 65 11/16/2012 1440   BILITOT 0.4 11/16/2012 1440   GFRNONAA >90 11/16/2012 1440   GFRAA >90 11/16/2012 1440   No results found for this basename: CHOL,  HDL,  LDLCALC,  LDLDIRECT,  TRIG,  CHOLHDL   No results found for this basename: HGBA1C   No results found for this basename: VITAMINB12   Lab Results  Component Value Date   TSH 0.715 06/12/2013     ASSESSMENT AND PLAN  37 y.o. year old female here with schizoaffective disorder, hepatitis B, hypertension, here with cognitive decline and abnormal involuntary movements over the past one year. This could be her related to underlying psychiatric disorders and medication  side effects (haldol). I will check MRI of the brain and lab testing to look at other secondary causes.  PLAN: Orders Placed This Encounter  Procedures  . Vitamin B12  . Ceruloplasmin  . Copper, urine, 24 hour  . Copper, Serum  . Thyroglobulin antibody  . Thyroid peroxidase antibody    Return in about 6 months (around 03/05/2014) for with Heide Guile or Brooklee Michelin.    Suanne Marker, MD 09/05/2013, 12:10 PM Certified in Neurology, Neurophysiology and Neuroimaging  [facility] [address], [address] [place], [place] [address] [phone]

## 2013-09-05 NOTE — Patient Instructions (Signed)
I will check labs and MRI. 

## 2013-10-30 ENCOUNTER — Ambulatory Visit
Admission: RE | Admit: 2013-10-30 | Discharge: 2013-10-30 | Disposition: A | Discharge status: Home / Self Care | Payer: Medicare Other | Source: Ambulatory Visit | Attending: Diagnostic Neuroimaging | Admitting: Diagnostic Neuroimaging

## 2013-10-30 DIAGNOSIS — F259 Schizoaffective disorder, unspecified: Secondary | ICD-10-CM

## 2013-10-30 DIAGNOSIS — G3184 Mild cognitive impairment, so stated: Secondary | ICD-10-CM

## 2013-11-07 ENCOUNTER — Ambulatory Visit
Admission: RE | Admit: 2013-11-07 | Discharge: 2013-11-07 | Disposition: A | Discharge status: Home / Self Care | Payer: Medicare Other | Source: Ambulatory Visit | Attending: Diagnostic Neuroimaging | Admitting: Diagnostic Neuroimaging

## 2013-11-07 DIAGNOSIS — G3184 Mild cognitive impairment, so stated: Secondary | ICD-10-CM | POA: Diagnosis not present

## 2013-11-07 DIAGNOSIS — F259 Schizoaffective disorder, unspecified: Secondary | ICD-10-CM | POA: Diagnosis not present

## 2014-03-06 ENCOUNTER — Encounter: Payer: Self-pay | Admitting: Nurse Practitioner

## 2014-03-06 ENCOUNTER — Ambulatory Visit (INDEPENDENT_AMBULATORY_CARE_PROVIDER_SITE_OTHER): Payer: Medicare Other | Admitting: Nurse Practitioner

## 2014-03-06 VITALS — BP 100/72 | HR 77 | Ht 63.0 in | Wt 178.0 lb

## 2014-03-06 DIAGNOSIS — G3184 Mild cognitive impairment, so stated: Secondary | ICD-10-CM

## 2014-03-06 DIAGNOSIS — F259 Schizoaffective disorder, unspecified: Secondary | ICD-10-CM | POA: Diagnosis not present

## 2014-03-06 DIAGNOSIS — R269 Unspecified abnormalities of gait and mobility: Secondary | ICD-10-CM | POA: Diagnosis not present

## 2014-03-06 DIAGNOSIS — R259 Unspecified abnormal involuntary movements: Secondary | ICD-10-CM

## 2014-03-06 DIAGNOSIS — G2571 Drug induced akathisia: Secondary | ICD-10-CM

## 2014-03-06 NOTE — Progress Notes (Signed)
PATIENT: [name] DOB: 1976-05-29  REASON FOR VISIT: follow up for gait problem, abnormal involuntary movements, cognitive slowing. 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 No mass effect on tectal plate. No hydrocephalus. 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 Remainder of brain parenchyma is normal.  Labs were not done after last visit.  Her symptoms have been stable in last 6 months per caseworker, but caseworker states she has had a dramatic change in the last 2 years regarding responsiveness and cognition.  UPDATE 09/05/13: Since last visit patient continues to have problems with cognitive slowing, withdrawal affect, gait and balance difficulty. She has a abnormal involuntary movements in the arms and legs. She's quite restless. She has not had MRI of the brain yet.  PRIOR HPI (07/02/13): 38 year old right-handed female with history of hepatitis B, hypertension, schizoaffective disorder, here for evaluation of cognitive slowing.  For past one year patient has had progressive psychomotor slowing, malaise, fatigue, cognitive difficulty. Patient does not feel like she is significantly worsened. She is on Prozac, Benadryl, Haldol for many years.  Patient tells me she graduated from high school, getting A's and B's in school. She denies any academic difficulty, developmental delay or special education needs.   REVIEW OF SYSTEMS: Full 14 system review of systems performed and notable only for sleepiness restless legs memory loss confusion slurred speech tremor too much sleep decreased energy disinterest in activities cough weight loss fatigue.  ALLERGIES: Allergies  Allergen Reactions  . Tylenol [Acetaminophen] Other (See Comments)    Patient is a hep B patient and is not allowed tylenol    HOME MEDICATIONS: Outpatient Prescriptions Prior to Visit  Medication Sig Dispense Refill  .  diphenhydrAMINE (SOMINEX) 25 MG tablet Take 25 mg by mouth 2 (two) times daily.      Marland Kitchen. FLUoxetine (PROZAC) 20 MG tablet Take 20 mg by mouth 2 (two) times daily.      . haloperidol (HALDOL) 10 MG tablet Take 10 mg by mouth every evening.      Marland Kitchen. lisinopril-hydrochlorothiazide (PRINZIDE,ZESTORETIC) 20-12.5 MG per tablet Take 1 tablet by mouth daily.      Marland Kitchen. LORazepam (ATIVAN) 0.5 MG tablet Take 0.5 mg by mouth at bedtime.      . meloxicam (MOBIC) 15 MG tablet Take 15 mg by mouth daily.      . traZODone (DESYREL) 100 MG tablet Take 100 mg by mouth at bedtime. 1.5 tab @@ hs       No facility-administered medications prior to visit.     PHYSICAL EXAM  Filed Vitals:   03/06/14 1038  BP: 100/72  Pulse: 77  Height: 5\' 3"  (1.6 m)  Weight: 178 lb (80.74 kg)   Body mass index is 31.54 kg/(m^2).  Generalized: Patient is in no distress; SLOW TO RESPOND, RESTLESS, ROCKING LEGS.  Head: normocephalic and atraumatic. Oropharynx benign  Neck: Supple, no carotid bruits  Cardiac: Regular rate rhythm, no murmur  Musculoskeletal: No deformity   NEUROLOGIC:  MENTAL STATUS: awake, alert, language fluent, comprehension intact, naming intact  CRANIAL NERVE: pupils equal and reactive to light, visual fields full to confrontation, extraocular muscles intact, no nystagmus, facial sensation and strength symmetric, uvula midline, shoulder shrug symmetric, tongue midline.  MOTOR: normal bulk and tone, full strength in the BUE, BLE  SENSORY: normal and symmetric to light touch, temperature, vibration  COORDINATION: finger-nose-finger, fine finger movements normal  REFLEXES: deep tendon reflexes  TRACE and symmetric  GAIT/STATION: narrow based gait; MARCHING TYPE GAIT WITH LEGS STIFF AND EXAGGERATED MOTION. ABLE TO WALK ON TOES, HEEL AND TANDEM.  DIAGNOSTIC DATA (LABS, IMAGING, TESTING) - I reviewed patient records, labs, notes, testing and imaging myself where available.  Lab Results  Component Value Date    TSH 0.715 06/12/2013    ASSESSMENT AND PLAN 38 y.o. female here with schizoaffective disorder, hepatitis B, hypertension, here with cognitive decline and abnormal involuntary movements over the past one year. This could be her related to underlying psychiatric disorders and medication side effects (haldol). MRI of the brain was unremarkable, showing an incidental pineal cyst.  Labs ordered at last visit were not done. Symptoms have been stable or last 6 months.  PLAN: Check labs previously ordered. Recommend follow up MRI Brain in 1 year, sooner as needed.  Orders Placed This Encounter  Procedures  . Ceruloplasmin  . Copper, serum  . Thyroglobulin antibody  . Thyroid Peroxidase Antibody  . Vitamin B12   Denise Harm E. Amaiyah Nordhoff, MSN, NP-C 03/06/2014, 11:19 AM [facility] [address], [address] [place], [place] [address] [phone]  Note: This document was prepared with digital dictation and possible smart phrase technology. Any transcriptional errors that result from this process are unintentional.

## 2014-03-06 NOTE — Patient Instructions (Addendum)
Check Labs today: Ceruloplasmin Copper, serum Thyroglobulin antibody Thyroid Peroxidase Antibody And B12.  MRI scan of the brain shows incidental pineal cyst, no other abnormalities, recommend repeat imaging in 1-2 years to follow.  Follow up as needed.

## 2014-03-08 LAB — THYROGLOBULIN ANTIBODY: Thyroglobulin Ab: <1 IU/mL (ref 0.0–0.9)

## 2014-03-08 LAB — CERULOPLASMIN: Ceruloplasmin: 23.5 mg/dL (ref 16.0–45.0)

## 2014-03-08 LAB — THYROID PEROXIDASE ANTIBODY: Thyroid Peroxidase Ab: 11 IU/mL (ref 0–34)

## 2014-03-08 LAB — COPPER, SERUM: Copper: 99 ug/dL (ref 72–166)

## 2014-03-08 LAB — VITAMIN B12: Vitamin B-12: 512 pg/mL (ref 211–946)

## 2014-04-05 NOTE — Progress Notes (Signed)
I reviewed note and agree with plan.   VIKRAM R. PENUMALLI, MD  Certified in Neurology, Neurophysiology and Neuroimaging  [facility] [address], [address] [place], [place] [address] [phone]   

## 2015-01-29 ENCOUNTER — Emergency Department (HOSPITAL_COMMUNITY): Payer: Medicare Other

## 2015-01-29 ENCOUNTER — Encounter (HOSPITAL_COMMUNITY): Payer: Self-pay

## 2015-01-29 ENCOUNTER — Emergency Department (HOSPITAL_COMMUNITY)
Admission: EM | Admit: 2015-01-29 | Discharge: 2015-01-29 | Disposition: A | Discharge status: Home / Self Care | Payer: Medicare Other | Attending: Emergency Medicine | Admitting: Emergency Medicine

## 2015-01-29 DIAGNOSIS — I10 Essential (primary) hypertension: Secondary | ICD-10-CM | POA: Diagnosis not present

## 2015-01-29 DIAGNOSIS — Z79899 Other long term (current) drug therapy: Secondary | ICD-10-CM | POA: Diagnosis not present

## 2015-01-29 DIAGNOSIS — R079 Chest pain, unspecified: Secondary | ICD-10-CM | POA: Diagnosis not present

## 2015-01-29 DIAGNOSIS — R0789 Other chest pain: Secondary | ICD-10-CM | POA: Insufficient documentation

## 2015-01-29 DIAGNOSIS — Z8619 Personal history of other infectious and parasitic diseases: Secondary | ICD-10-CM | POA: Diagnosis not present

## 2015-01-29 DIAGNOSIS — F209 Schizophrenia, unspecified: Secondary | ICD-10-CM | POA: Insufficient documentation

## 2015-01-29 DIAGNOSIS — Z791 Long term (current) use of non-steroidal anti-inflammatories (NSAID): Secondary | ICD-10-CM | POA: Diagnosis not present

## 2015-01-29 DIAGNOSIS — F319 Bipolar disorder, unspecified: Secondary | ICD-10-CM | POA: Insufficient documentation

## 2015-01-29 DIAGNOSIS — J45909 Unspecified asthma, uncomplicated: Secondary | ICD-10-CM | POA: Diagnosis not present

## 2015-01-29 DIAGNOSIS — Z720 Tobacco use: Secondary | ICD-10-CM | POA: Diagnosis not present

## 2015-01-29 LAB — RAPID URINE DRUG SCREEN, HOSP PERFORMED
Amphetamines: NOT DETECTED
Barbiturates: NOT DETECTED
Benzodiazepines: NOT DETECTED
Cocaine: NOT DETECTED
OPIATES: NOT DETECTED
TETRAHYDROCANNABINOL: NOT DETECTED

## 2015-01-29 LAB — I-STAT TROPONIN, ED
Troponin i, poc: 0 ng/mL (ref 0.00–0.08)
Troponin i, poc: 0 ng/mL (ref 0.00–0.08)

## 2015-01-29 LAB — BASIC METABOLIC PANEL
Anion gap: 6 (ref 5–15)
BUN: 9 mg/dL (ref 6–23)
CHLORIDE: 104 mmol/L (ref 96–112)
CO2: 29 mmol/L (ref 19–32)
Calcium: 9 mg/dL (ref 8.4–10.5)
Creatinine, Ser: 0.97 mg/dL (ref 0.50–1.10)
GFR calc non Af Amer: 72 mL/min — ABNORMAL LOW (ref >90)
GFR, EST AFRICAN AMERICAN: 84 mL/min — AB (ref >90)
GLUCOSE: 83 mg/dL (ref 70–99)
POTASSIUM: 4.4 mmol/L (ref 3.5–5.1)
Sodium: 139 mmol/L (ref 135–145)

## 2015-01-29 LAB — CBC WITH DIFFERENTIAL/PLATELET
BASOS ABS: 0 10*3/uL (ref 0.0–0.1)
BASOS PCT: 0 % (ref 0–1)
EOS ABS: 0.3 10*3/uL (ref 0.0–0.7)
EOS PCT: 3 % (ref 0–5)
HCT: 39.7 % (ref 36.0–46.0)
Hemoglobin: 13 g/dL (ref 12.0–15.0)
Lymphocytes Relative: 31 % (ref 12–46)
Lymphs Abs: 3.3 10*3/uL (ref 0.7–4.0)
MCH: 32 pg (ref 26.0–34.0)
MCHC: 32.7 g/dL (ref 30.0–36.0)
MCV: 97.8 fL (ref 78.0–100.0)
Monocytes Absolute: 0.5 10*3/uL (ref 0.1–1.0)
Monocytes Relative: 5 % (ref 3–12)
Neutro Abs: 6.4 10*3/uL (ref 1.7–7.7)
Neutrophils Relative %: 61 % (ref 43–77)
PLATELETS: 211 10*3/uL (ref 150–400)
RBC: 4.06 MIL/uL (ref 3.87–5.11)
RDW: 13.5 % (ref 11.5–15.5)
WBC: 10.5 10*3/uL (ref 4.0–10.5)

## 2015-01-29 LAB — D-DIMER, QUANTITATIVE (NOT AT ARMC): D-Dimer, Quant: <0.27 ug/mL-FEU (ref 0.00–0.48)

## 2015-01-29 MED ORDER — GI COCKTAIL ~~LOC~~
30.0000 mL | Freq: Once | ORAL | Status: AC
  Administered 2015-01-29: 30 mL via ORAL
  Filled 2015-01-29: qty 30

## 2015-01-29 MED ORDER — SODIUM CHLORIDE 0.9 % IV BOLUS (SEPSIS)
1000.0000 mL | Freq: Once | INTRAVENOUS | Status: AC
  Administered 2015-01-29: 1000 mL via INTRAVENOUS

## 2015-01-29 MED ORDER — KETOROLAC TROMETHAMINE 30 MG/ML IJ SOLN
30.0000 mg | Freq: Once | INTRAMUSCULAR | Status: AC
  Administered 2015-01-29: 30 mg via INTRAVENOUS
  Filled 2015-01-29: qty 1

## 2015-01-29 NOTE — ED Provider Notes (Signed)
Complains of left-sided anterior chest pain, pleuritic, gradual onset 1 PM today. Associated symptoms include shortness of breath. No nausea or sweatiness. She was treated by EMS with aspirin and sublingual nitroglycerin which did not alleviate her pain however it does cause lightheadedness. Treated here with Toradol which has alleviated her discomfort. On exam alert no distress lungs clear auscultation heart regular rate and rhythm no murmurs or rubs chest is exquisitely tender left side anteriorly. Pain is also reproducible by forcible abduction of left shoulder. Cardiac risk factors include smoking and family history. Heart score equals 2 based on EKG criteria and cardiac risk factors. Chest x-ray and EKG viewed by me Low pretest clinical probability for pulmonary embolism. Negative d-dimer  Doug SouSam Dashun Borre, MD 01/29/15 [id number]

## 2015-01-29 NOTE — ED Notes (Signed)
Pt alert x4 ambulates without distress. 

## 2015-01-29 NOTE — ED Provider Notes (Signed)
Sign out received from [facility] C[facility], PA-C and Dr. Ethelda ChickJacubowitz.  "Complains of left-sided anterior chest pain, pleuritic, gradual onset 1 PM today. Associated symptoms include shortness of breath. No nausea or sweatiness. She was treated by EMS with aspirin and sublingual nitroglycerin which did not alleviate her pain however it does cause lightheadedness. Treated here with Toradol which has alleviated her discomfort. On exam alert no distress lungs clear auscultation heart regular rate and rhythm no murmurs or rubs chest is exquisitely tender left side anteriorly. Pain is also reproducible by forcible abduction of left shoulder. Cardiac risk factors include smoking and family history. Heart score equals 2 based on EKG criteria and cardiac risk factors. Chest x-ray and EKG viewed by me Low pretest clinical probability for pulmonary embolism. Negative d-dimer."   I am waiting for delta Troponin at 7: 30pm to result.   8:07pm The delta trop is negative. i went and spoke with the patient and she is feeling much better and requests to go home. She is not having any pain. Dr. Ethelda ChickJacubowitz feels like this pain is musculoskeletal in that she is not at risk for PE or acute ACS. The patient will be sent home and asked to  follow-up appointment with her primary care provider. Family member with patient is comfortable with this plan as well. VSS pt appears well- smiling.   39 y.o.[name]'s evaluation in the Emergency Department is complete. It has been determined that no acute conditions requiring further emergency intervention are present at this time. The patient/guardian have been advised of the diagnosis and plan. We have discussed signs and symptoms that warrant return to the ED, such as changes or worsening in symptoms.  Vital signs are stable at discharge. Filed Vitals:   01/29/15 1930  BP: 104/55  Pulse: 87  Temp:   Resp:     Patient/guardian has voiced understanding and agreed to  follow-up with the PCP or specialist.   Marlon Peliffany Abdikadir Fohl, PA-C 01/29/15 2009  Bethann BerkshireJoseph Zammit, MD 01/29/15 2025

## 2015-01-29 NOTE — ED Provider Notes (Signed)
CSN: [id number]     Arrival date & time 01/29/15  1510 History   First MD Initiated Contact with Patient 01/29/15 1524     Chief Complaint  Patient presents with  . Chest Pain     (Consider location/radiation/quality/duration/timing/severity/associated sxs/prior Treatment) HPI Comments: [name] is a 39 y.o. female with a PMHx of HTN, asthma, hepatitis B, bipolar 1 disorder, and schizophrenia, who presents to the ED via EMS with complaints of sudden onset left-sided chest pain that began while she was lying in bed at rest around 1:30 PM. She reports the pain is 9/10, located in the left side of her anterior chest, constant, sharp, nonradiating, worse with movement and inspiration, minimally improved with aspirin and one nitroglycerin given by EMS. EMS reporting that her blood pressure dropped to 90s/50s after nitroglycerin was given, therefore they stopped any further doses. She reports associated lightheadedness when her chest pain began. She denies that her pain is worsened with exertion. She denies any fevers, chills, shortness of breath, cough, hemoptysis, leg swelling, recent travel/surgery/immobilization, estrogen use, history of DVT/PE, abdominal pain, nausea, vomiting, diarrhea, constipation, dysuria, hematuria, vaginal bleeding or discharge, myalgias, arthralgias, neck or back pain, numbness, tingling, weakness, diaphoresis, claudication, PND, or orthopnea. +FHx of MI in her mother and grandmother. No personal hx of cardiac disease. Smokes 1/4ppd. Denies recent trauma, heavy lifting, sick contacts, EtOH use, or IVDU.  Patient is a 39 y.o. female presenting with chest pain. The history is provided by the patient. No language interpreter was used.  Chest Pain Pain location:  L chest Pain quality: sharp   Pain radiates to:  Does not radiate Pain radiates to the back: no   Pain severity:  Severe Onset quality:  Sudden Duration:  2 hours Timing:  Constant Progression:   Unchanged Chronicity:  New Context: at rest   Relieved by:  Aspirin and nitroglycerin Worsened by:  Deep breathing and movement Ineffective treatments:  None tried Associated symptoms: no abdominal pain, no altered mental status, no back pain, no claudication, no cough, no diaphoresis, no dizziness, no fever, no heartburn, no lower extremity edema, no nausea, no near-syncope, no numbness, no orthopnea, no PND, no shortness of breath, no syncope, not vomiting and no weakness   Risk factors: hypertension and smoking   Risk factors: no birth control, no coronary artery disease, no diabetes mellitus, no high cholesterol, no immobilization and no prior DVT/PE     Past Medical History  Diagnosis Date  . Hypertension   . Asthma   . Hepatitis B infection   . Depression   . Bipolar 1 disorder   . Schizophrenia   . Ectopic pregnancy    Past Surgical History  Procedure Laterality Date  . Diagnostic laparoscopy with removal of ectopic pregnancy     Family History  Problem Relation Age of Onset  . Heart disease Mother   . Stroke Mother   . Asthma Mother   . Hypertension Father    History  Substance Use Topics  . Smoking status: Current Every Day Smoker -- 0.50 packs/day    Types: Cigarettes  . Smokeless tobacco: Never Used  . Alcohol Use: No   OB History    No data available     Review of Systems  Constitutional: Negative for fever, chills and diaphoresis.  Respiratory: Negative for cough and shortness of breath.   Cardiovascular: Positive for chest pain. Negative for orthopnea, claudication, leg swelling, syncope, PND and near-syncope.  Gastrointestinal: Negative for heartburn, nausea, vomiting,  abdominal pain, diarrhea and constipation.  Genitourinary: Negative for dysuria, hematuria, vaginal bleeding and vaginal discharge.  Musculoskeletal: Negative for myalgias, back pain, arthralgias and neck pain.  Skin: Negative for color change.  Allergic/Immunologic: Negative for  immunocompromised state.  Neurological: Positive for light-headedness (now resolved). Negative for dizziness, syncope, weakness and numbness.  Psychiatric/Behavioral: Negative for confusion.   10 Systems reviewed and are negative for acute change except as noted in the HPI.    Allergies  Tylenol  Home Medications   Prior to Admission medications   Medication Sig Start Date End Date Taking? Authorizing Provider  diphenhydrAMINE (SOMINEX) 25 MG tablet Take 25 mg by mouth 2 (two) times daily.    Historical Provider, MD  FLUoxetine (PROZAC) 20 MG tablet Take 20 mg by mouth 2 (two) times daily.    Historical Provider, MD  haloperidol (HALDOL) 10 MG tablet Take 10 mg by mouth every evening.    Historical Provider, MD  lisinopril-hydrochlorothiazide (PRINZIDE,ZESTORETIC) 20-12.5 MG per tablet Take 1 tablet by mouth daily.    Historical Provider, MD  LORazepam (ATIVAN) 0.5 MG tablet Take 0.5 mg by mouth at bedtime.    Historical Provider, MD  meloxicam (MOBIC) 15 MG tablet Take 15 mg by mouth daily.    Historical Provider, MD  traZODone (DESYREL) 100 MG tablet Take 100 mg by mouth at bedtime. 1.5 tab @@ hs    Historical Provider, MD   BP 92/56 mmHg  Pulse 90  Temp(Src) 98.6 F (37 C) (Oral)  Resp 23  Ht  (1.626 m)  Wt 168 lb (76.204 kg)  BMI 28.82 kg/m2  SpO2 98% Physical Exam  Constitutional: She is oriented to person, place, and time. She appears well-developed and well-nourished.  Non-toxic appearance. No distress.  Afebrile, nontoxic, NAD. BP 90/50s upon arrival after NTG was given en route, otherwise VSS  HENT:  Head: Normocephalic and atraumatic.  Mouth/Throat: Oropharynx is clear and moist and mucous membranes are normal.  Eyes: Conjunctivae and EOM are normal. Right eye exhibits no discharge. Left eye exhibits no discharge.  Neck: Normal range of motion. Neck supple. No JVD present.  Cardiovascular: Normal rate, regular rhythm, normal heart sounds and intact distal pulses.   Exam reveals no gallop and no friction rub.   No murmur heard. RRR, nl s1/s2, no m/r/g, distal pulses intact, no pedal edema   Pulmonary/Chest: Effort normal and breath sounds normal. No respiratory distress. She has no decreased breath sounds. She has no wheezes. She has no rhonchi. She has no rales. She exhibits tenderness. She exhibits no crepitus, no deformity and no retraction.    CTAB in all lung fields, no w/r/r, no hypoxia or increased WOB, speaking in full sentences, SpO2 98% on RA Mild anterior chest wall TTP in sternal and L side, no crepitus or retractions, no deformities   Abdominal: Soft. Normal appearance and bowel sounds are normal. She exhibits no distension. There is no tenderness. There is no rigidity, no rebound, no guarding, no CVA tenderness, no tenderness at McBurney's point and negative Murphy's sign.  Musculoskeletal: Normal range of motion.  MAE x4 Strength and sensation grossly intact Distal pulses intact No pedal edema, neg homan's bilaterally  Gait steady  Neurological: She is alert and oriented to person, place, and time. She has normal strength. No sensory deficit. Gait normal.  Skin: Skin is warm, dry and intact. No rash noted.  Psychiatric:  Flat affect  Nursing note and vitals reviewed.   ED Course  Procedures (including critical  care time) Labs Review Labs Reviewed  BASIC METABOLIC PANEL - Abnormal; Notable for the following:    GFR calc non Af Amer 72 (*)    GFR calc Af Amer 84 (*)    All other components within normal limits  CBC WITH DIFFERENTIAL/PLATELET  URINE RAPID DRUG SCREEN (HOSP PERFORMED)  D-DIMER, QUANTITATIVE  I-STAT TROPOININ, ED    Imaging Review Dg Chest 2 View  01/29/2015   CLINICAL DATA:  Chest pain and short of breath today.  EXAM: CHEST  2 VIEW  COMPARISON:  05/23/2010  FINDINGS: Heart size is normal. Mediastinal shadows are normal. There is mild chronic scarring at the medial left base. There may be mild scarring or  atelectasis newly seen in the right mid lung. The vascularity is normal. No effusions. No acute bony finding. Chronic degenerative changes of the right shoulder.  IMPRESSION: Chronic scarring at the left lung base. Newly seen density at the right mid lung which could be scarring or mild atelectasis.   Electronically Signed   By: Paulina Fusi M.D.   On: 01/29/2015 16:25     EKG Interpretation None     ED ECG REPORT   Date: 01/29/2015  Rate: 85  Rhythm: normal sinus rhythm  QRS Axis: normal  Intervals: normal  ST/T Wave abnormalities: early repolarization  Conduction Disutrbances:none  Narrative Interpretation:   Old EKG Reviewed: today's EKG with no T wave flattening as noted from prior EKG  I have personally reviewed the EKG tracing and agree with the computerized printout as noted.   MDM   Final diagnoses:  Chest pain, unspecified chest pain type  Tobacco use    39 y.o. female here with sudden onset CP at rest, worse with breathing and movement, denies SOB. States she felt lightheaded at the time of her CP beginning. No change with exertion. No jaw or back pain, no hypoxia or tachycardia. +FHx of MI in mother and grandmother. Given ASA and NTGx1, BP dropped after NTG therefore no further NTG given. Pain is reproducible on exam which raises suspicion of musculoskeletal etiology. Will obtain labs, UDS, EKG, CXR, and dimer (low suspicion for PE, but given her inspiratory chest pain, will obtain dimer). Will give GI cocktail and toradol now as well as bolus of fluids to help improve her BP.   4:58 PM Trop neg. CBC w/diff unremarkable. BMP and dimer in process. UDS neg. CXR with chronic scarring at L lung base and R mid lung scarring or atelectasis. EKG with early repol but no ischemic changes. Pain improved after toradol. Care signed over to Marlon Pel PA-C at shift change, who will f/up with rest of labs and perform delta trop at 7:30pm, then likely d/c home with tylenol/motrin for  pain since this seems more c/w musculoskeletal pain, assuming all other pending labs are unremarkable. Please see her note for further documentation of care and dispo.  BP 92/56 mmHg  Pulse 90  Temp(Src) 98.6 F (37 C) (Oral)  Resp 23  Ht  (1.626 m)  Wt 168 lb (76.204 kg)  BMI 28.82 kg/m2  SpO2 98%  Meds ordered this encounter  Medications  . gi cocktail (Maalox,Lidocaine,Donnatal)    Sig:   . sodium chloride 0.9 % bolus 1,000 mL    Sig:   . ketorolac (TORADOL) 30 MG/ML injection 30 mg    Sig:      Jospeh Mangel Camprubi-Soms, PA-C 01/29/15 1711  Satya Buttram Camprubi-Soms, PA-C 01/29/15 1711  Doug Sou, MD 01/29/15 1733

## 2015-01-29 NOTE — Discharge Instructions (Signed)
°  Chest wall pain is pain felt in or around the chest bones and muscles. It may take up to 6 weeks to get better. It may take longer if you are active. Chest wall pain can happen on its own. Other times, things like germs, injury, coughing, or exercise can cause the pain. HOME CARE   Avoid activities that make you tired or cause pain. Try not to use your chest, belly (abdominal), or side muscles. Do not use heavy weights.  Put ice on the sore area.  Put ice in a plastic bag.  Place a towel between your skin and the bag.  Leave the ice on for 15-20 minutes for the first 2 days.  Only take medicine as told by your doctor. GET HELP RIGHT AWAY IF:   You have more pain or are very uncomfortable.  You have a fever.  Your chest pain gets worse.  You have new problems.  You feel sick to your stomach (nauseous) or throw up (vomit).  You start to sweat or feel lightheaded.  You have a cough with mucus (phlegm).  You cough up blood. MAKE SURE YOU:   Understand these instructions.  Will watch your condition.  Will get help right away if you are not doing well or get worse. Document Released: 03/29/2008 Document Revised: 01/03/2012 Document Reviewed: 06/07/2011 [facility] Patient Information 2015 [place], [place]. This information is not intended to replace advice given to you by your health care provider. Make sure you discuss any questions you have with your health care provider.

## 2015-01-29 NOTE — ED Notes (Signed)
EMS c/o sudden onset sharp cp left chest increases with inspiration. Deies n/v or sob.

## 2015-02-13 ENCOUNTER — Emergency Department (HOSPITAL_COMMUNITY)
Admission: EM | Admit: 2015-02-13 | Discharge: 2015-02-14 | Disposition: A | Discharge status: Other Acute Inpatient Hospital | Payer: Medicare Other | Attending: Emergency Medicine | Admitting: Emergency Medicine

## 2015-02-13 ENCOUNTER — Encounter (HOSPITAL_COMMUNITY): Payer: Self-pay | Admitting: Cardiology

## 2015-02-13 DIAGNOSIS — Z3202 Encounter for pregnancy test, result negative: Secondary | ICD-10-CM | POA: Diagnosis not present

## 2015-02-13 DIAGNOSIS — Z79899 Other long term (current) drug therapy: Secondary | ICD-10-CM | POA: Diagnosis not present

## 2015-02-13 DIAGNOSIS — J45909 Unspecified asthma, uncomplicated: Secondary | ICD-10-CM | POA: Diagnosis not present

## 2015-02-13 DIAGNOSIS — Z8619 Personal history of other infectious and parasitic diseases: Secondary | ICD-10-CM | POA: Diagnosis not present

## 2015-02-13 DIAGNOSIS — R45851 Suicidal ideations: Secondary | ICD-10-CM | POA: Diagnosis not present

## 2015-02-13 DIAGNOSIS — Z720 Tobacco use: Secondary | ICD-10-CM | POA: Insufficient documentation

## 2015-02-13 DIAGNOSIS — I10 Essential (primary) hypertension: Secondary | ICD-10-CM | POA: Insufficient documentation

## 2015-02-13 LAB — CBC
HEMATOCRIT: 39.4 % (ref 36.0–46.0)
Hemoglobin: 13.3 g/dL (ref 12.0–15.0)
MCH: 32.2 pg (ref 26.0–34.0)
MCHC: 33.8 g/dL (ref 30.0–36.0)
MCV: 95.4 fL (ref 78.0–100.0)
PLATELETS: 270 10*3/uL (ref 150–400)
RBC: 4.13 MIL/uL (ref 3.87–5.11)
RDW: 13.4 % (ref 11.5–15.5)
WBC: 9.7 10*3/uL (ref 4.0–10.5)

## 2015-02-13 LAB — ACETAMINOPHEN LEVEL

## 2015-02-13 LAB — COMPREHENSIVE METABOLIC PANEL
ALT: 9 U/L (ref 0–35)
AST: 22 U/L (ref 0–37)
Albumin: 3.5 g/dL (ref 3.5–5.2)
Alkaline Phosphatase: 63 U/L (ref 39–117)
Anion gap: 9 (ref 5–15)
BILIRUBIN TOTAL: 0.3 mg/dL (ref 0.3–1.2)
BUN: 10 mg/dL (ref 6–23)
CALCIUM: 9.1 mg/dL (ref 8.4–10.5)
CHLORIDE: 106 mmol/L (ref 96–112)
CO2: 25 mmol/L (ref 19–32)
CREATININE: 1.13 mg/dL — AB (ref 0.50–1.10)
GFR calc Af Amer: 69 mL/min — ABNORMAL LOW (ref >90)
GFR, EST NON AFRICAN AMERICAN: 60 mL/min — AB (ref >90)
Glucose, Bld: 131 mg/dL — ABNORMAL HIGH (ref 70–99)
POTASSIUM: 3.7 mmol/L (ref 3.5–5.1)
SODIUM: 140 mmol/L (ref 135–145)
Total Protein: 6.4 g/dL (ref 6.0–8.3)

## 2015-02-13 LAB — ETHANOL: Alcohol, Ethyl (B): <5 mg/dL (ref 0–9)

## 2015-02-13 LAB — RAPID URINE DRUG SCREEN, HOSP PERFORMED
Amphetamines: NOT DETECTED
BARBITURATES: NOT DETECTED
BENZODIAZEPINES: NOT DETECTED
Cocaine: NOT DETECTED
Opiates: NOT DETECTED
Tetrahydrocannabinol: NOT DETECTED

## 2015-02-13 LAB — SALICYLATE LEVEL

## 2015-02-13 LAB — POC URINE PREG, ED: Preg Test, Ur: NEGATIVE

## 2015-02-13 MED ORDER — IBUPROFEN 400 MG PO TABS
600.0000 mg | ORAL_TABLET | Freq: Once | ORAL | Status: AC
  Administered 2015-02-13: 600 mg via ORAL
  Filled 2015-02-13 (×2): qty 1

## 2015-02-13 NOTE — ED Notes (Addendum)
Staffing Called for sitter. Security called for wanding. Consulting civil engineerCharge RN notified.

## 2015-02-13 NOTE — ED Notes (Signed)
Pt reports that she has been hearing voices for the past month that have been telling her to hurt herself. Reports that she would cut herself. Denies any ETOH or Drug use. Denies any HI.

## 2015-02-13 NOTE — ED Provider Notes (Signed)
CSN: [id number]     Arrival date & time 02/13/15  1842 History   First MD Initiated Contact with Patient 02/13/15 2023     Chief Complaint  Patient presents with  . Suicidal   HPI   39 year old female presents today with reports of hearing voices. Patient's caregiver in attendance during interview. Patient reports a significant past medical history of schizophrenia that started when she stood 39 years old. She reports that she started hearing voices at that time over the years that developed into stronger more commanding voices. She states that over the past 3 years she was taking trazodone, Haldol, and an antidepressant. She reports that due to side effects which neither her or the caregiver can specifically note, they abruptly discontinued her medications. They state that she was not taking any antipsychotics until approximately 2 weeks ago and she was placed on Latuda. Patient reports that approximately 2 weeks ago the voices became stronger and were telling her to "cut herself". She reports that she would have done this but that she is always surrounded by somebody that keeps her from doing it. Patient reports that in addition to the voices she is having a headache that started today, reports it is frontal, no radiation of symptoms, similar to previous headaches, no neurological deficits. She reports that she can take ibuprofen for this she usually helps but has not tried that today. When asked if she wanted to be here patient reported "yes" I need help with these voices. Patient reports that she feels safe in her current living situation, has no other complaints in addition to left chronic knee pain, and otherwise feels healthy.  Past Medical History  Diagnosis Date  . Hypertension   . Asthma   . Hepatitis B infection   . Depression   . Bipolar 1 disorder   . Schizophrenia   . Ectopic pregnancy    Past Surgical History  Procedure Laterality Date  . Diagnostic laparoscopy with removal of  ectopic pregnancy     Family History  Problem Relation Age of Onset  . Heart disease Mother   . Stroke Mother   . Asthma Mother   . Hypertension Father    History  Substance Use Topics  . Smoking status: Current Every Day Smoker -- 0.50 packs/day    Types: Cigarettes  . Smokeless tobacco: Never Used  . Alcohol Use: No   OB History    No data available     Review of Systems  All other systems reviewed and are negative.   Allergies  Tomato and Tylenol  Home Medications   Prior to Admission medications   Medication Sig Start Date End Date Taking? Authorizing Provider  benztropine (COGENTIN) 1 MG tablet Take 1 mg by mouth at bedtime.   Yes Historical Provider, MD  lurasidone (LATUDA) 40 MG TABS tablet Take 120 mg by mouth daily with breakfast.    Yes Historical Provider, MD   BP 118/87 mmHg  Pulse 90  Temp(Src) 98.7 F (37.1 C) (Oral)  Resp 18  Wt 181 lb (82.101 kg)  SpO2 97% Physical Exam  Constitutional: She is oriented to person, place, and time. She appears well-developed and well-nourished.  HENT:  Head: Normocephalic and atraumatic.  Eyes: Pupils are equal, round, and reactive to light.  Neck: Normal range of motion. Neck supple. No JVD present. No tracheal deviation present. No thyromegaly present.  Cardiovascular: Normal rate, regular rhythm, normal heart sounds and intact distal pulses.  Exam reveals no gallop and  no friction rub.   No murmur heard. Pulmonary/Chest: Effort normal and breath sounds normal. No stridor. No respiratory distress. She has no wheezes. She has no rales. She exhibits no tenderness.  Musculoskeletal: Normal range of motion.  Lymphadenopathy:    She has no cervical adenopathy.  Neurological: She is alert and oriented to person, place, and time. Coordination normal.  Skin: Skin is warm and dry.  Psychiatric: Her behavior is normal. Judgment normal.  Nursing note and vitals reviewed.   ED Course  Procedures (including critical care  time) Labs Review Labs Reviewed  COMPREHENSIVE METABOLIC PANEL - Abnormal; Notable for the following:    Glucose, Bld 131 (*)    Creatinine, Ser 1.13 (*)    GFR calc non Af Amer 60 (*)    GFR calc Af Amer 69 (*)    All other components within normal limits  ACETAMINOPHEN LEVEL - Abnormal; Notable for the following:    Acetaminophen (Tylenol), Serum <10.0 (*)    All other components within normal limits  CBC  ETHANOL  SALICYLATE LEVEL  URINE RAPID DRUG SCREEN (HOSP PERFORMED)  POC URINE PREG, ED    Imaging Review No results found.   EKG Interpretation None      MDM   Final diagnoses:  Suicidal ideations    Labs: Urine, CBC, CMP, ethanol, acetaminophen, salicylate -no significant findings  Imaging: None indicated  Consults: TTS  Therapeutics: Ibuprofen  Assessment: Schizophrenia and suicidal ideations  Plan: Patient was evaluated with reports of hearing voices telling her to cut herself, patient admittedly wanted to be here seeking care. She had a headache normal for her no acute findings on my neuro exam, she was given ibuprofen for this. No acute concerns were noted with exam or labs. Patient was medically cleared.      Eyvonne MechanicJeffrey Debra Colon, PA-C 02/14/15 0134  Rolland PorterMark James, MD 02/25/15 [phone]

## 2015-02-13 NOTE — ED Notes (Signed)
Sitter with pt. Pt in paper scrubs.

## 2015-02-13 NOTE — ED Notes (Signed)
Patient belongings sent home with care giver.

## 2015-02-13 NOTE — ED Notes (Signed)
Pt unable to void at this time. 

## 2015-02-14 ENCOUNTER — Encounter (HOSPITAL_COMMUNITY): Payer: Self-pay | Admitting: *Deleted

## 2015-02-14 DIAGNOSIS — F209 Schizophrenia, unspecified: Secondary | ICD-10-CM | POA: Diagnosis not present

## 2015-02-14 DIAGNOSIS — M1990 Unspecified osteoarthritis, unspecified site: Secondary | ICD-10-CM | POA: Diagnosis present

## 2015-02-14 DIAGNOSIS — T434X6A Underdosing of butyrophenone and thiothixene neuroleptics, initial encounter: Secondary | ICD-10-CM | POA: Diagnosis present

## 2015-02-14 DIAGNOSIS — Z62810 Personal history of physical and sexual abuse in childhood: Secondary | ICD-10-CM | POA: Diagnosis present

## 2015-02-14 DIAGNOSIS — R45851 Suicidal ideations: Secondary | ICD-10-CM | POA: Diagnosis present

## 2015-02-14 DIAGNOSIS — B1910 Unspecified viral hepatitis B without hepatic coma: Secondary | ICD-10-CM | POA: Diagnosis present

## 2015-02-14 DIAGNOSIS — Z91128 Patient's intentional underdosing of medication regimen for other reason: Secondary | ICD-10-CM | POA: Diagnosis present

## 2015-02-14 DIAGNOSIS — F259 Schizoaffective disorder, unspecified: Secondary | ICD-10-CM | POA: Diagnosis not present

## 2015-02-14 DIAGNOSIS — T424X6A Underdosing of benzodiazepines, initial encounter: Secondary | ICD-10-CM | POA: Diagnosis present

## 2015-02-14 DIAGNOSIS — J45909 Unspecified asthma, uncomplicated: Secondary | ICD-10-CM | POA: Diagnosis present

## 2015-02-14 DIAGNOSIS — I10 Essential (primary) hypertension: Secondary | ICD-10-CM | POA: Diagnosis present

## 2015-02-14 MED ORDER — LURASIDONE HCL 80 MG PO TABS
120.0000 mg | ORAL_TABLET | Freq: Every day | ORAL | Status: DC
  Administered 2015-02-14: 120 mg via ORAL
  Filled 2015-02-14 (×2): qty 1

## 2015-02-14 MED ORDER — BENZTROPINE MESYLATE 1 MG PO TABS: 1.0000 mg | ORAL_TABLET | Freq: Every day | ORAL | Status: DC

## 2015-02-14 MED ORDER — LORAZEPAM 1 MG PO TABS
1.0000 mg | ORAL_TABLET | Freq: Three times a day (TID) | ORAL | Status: DC | PRN
  Administered 2015-02-14: 1 mg via ORAL
  Filled 2015-02-14: qty 1

## 2015-02-14 NOTE — ED Notes (Signed)
Patient up to desk to call her fiance to let him know she is moving.

## 2015-02-14 NOTE — ED Notes (Signed)
[facility] HAS PATIENT BELONGINGS. CALLED [facility] AND MADE THEM AWARE PT ENROUTE VIA [facility]

## 2015-02-14 NOTE — ED Provider Notes (Signed)
  Physical Exam  BP 110/68 mmHg  Pulse 81  Temp(Src) 99 F (37.2 C) (Oral)  Resp 16  Wt 181 lb (82.101 kg)  SpO2 95%  Physical Exam  ED Course  Procedures  [facility] Dr Guerry MinorsLobao      Monta Maiorana, MD 02/14/15 [phone]

## 2015-02-14 NOTE — Progress Notes (Addendum)
Faxed referrals to the following facilities with bed availability: [facility]- per Fayne MediateJustin [facility]- per Trudie ReedLacey [facility]- per Sherlon Handingracy [facility]- per Gloris Hamalvin [facility]-  [facility]- per Candace  [facility] Minerva AreolaEric Accepting MD is Dr. Maureen ChattersLobao, [facility] [phone].  Spoke with [facility] RN regarding pt disposition.  Ilean SkillMeghan Jeanmarie Mccowen, MSW, LCSWA Clinical Social Work, Disposition  02/14/2015 [phone]
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
## 2015-02-14 NOTE — ED Notes (Signed)
TTS in progress 

## 2015-03-12 ENCOUNTER — Encounter: Payer: Self-pay | Admitting: Diagnostic Neuroimaging

## 2015-03-12 ENCOUNTER — Ambulatory Visit (INDEPENDENT_AMBULATORY_CARE_PROVIDER_SITE_OTHER): Payer: Medicare Other | Admitting: Diagnostic Neuroimaging

## 2015-03-12 VITALS — BP 100/60 | HR 73 | Ht 63.0 in | Wt 204.3 lb

## 2015-03-12 DIAGNOSIS — E348 Other specified endocrine disorders: Secondary | ICD-10-CM

## 2015-03-12 DIAGNOSIS — G2571 Drug induced akathisia: Secondary | ICD-10-CM

## 2015-03-12 DIAGNOSIS — R259 Unspecified abnormal involuntary movements: Secondary | ICD-10-CM | POA: Diagnosis not present

## 2015-03-12 DIAGNOSIS — R450 Nervousness: Secondary | ICD-10-CM

## 2015-03-12 NOTE — Progress Notes (Signed)
[facility]  PATIENT: [name] DOB: [birth year]  REFERRING CLINICIAN:  HISTORY FROM: patient REASON FOR VISIT: follow up   HISTORICAL  CHIEF COMPLAINT:  Chief Complaint  Patient presents with  . Follow-up    schizoaffective disorder     HISTORY OF PRESENT ILLNESS:   UPDATE 03/12/15: Since last visit, patient feels better. Movements have decreased. She feels more alert. She attributes this to better medication regimen than previously.  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 No mass effect on tectal plate. No hydrocephalus. 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 Remainder of brain parenchyma is normal. Labs were not done after last visit. Her symptoms have been stable in last 6 months per caseworker, but caseworker states she has had a dramatic change in the last 2 years regarding responsiveness and cognition.  UPDATE 09/05/13 (VRP): Since last visit patient continues to have problems with cognitive slowing, withdrawal affect, gait and balance difficulty. She has a abnormal involuntary movements in the arms and legs. She's quite restless. She has not had MRI of the brain yet.  PRIOR HPI (07/02/13, VRP): 39 year old right-handed female with history of hepatitis B, hypertension, schizoaffective disorder, here for evaluation of cognitive slowing. For past one year patient has had progressive psychomotor slowing, malaise, fatigue, cognitive difficulty. Patient does not feel like she is significantly worsened. She is on Prozac, Benadryl, Haldol for many years. Patient tells me she graduated from high school, getting A's and B's in school. She denies any academic difficulty, developmental delay or special education needs.   REVIEW OF SYSTEMS: Full 14 system review of systems performed and notable only for sdecr appetite chills wheezing ear discharge runny nose headache tremors depression anxiety freq waking acting out  dreams.    ALLERGIES: Allergies  Allergen Reactions  . Tomato     swelling  . Tylenol [Acetaminophen] Other (See Comments)    Patient is a hep B patient and is not allowed tylenol    HOME MEDICATIONS:  Outpatient Encounter Prescriptions as of 03/12/2015  Medication Sig  . FLUoxetine (PROZAC) 20 MG capsule Take 20 mg by mouth daily.  . haloperidol (HALDOL) 5 MG tablet   . hydrOXYzine (ATARAX/VISTARIL) 50 MG tablet Take 50 mg by mouth at bedtime.  . meloxicam (MOBIC) 15 MG tablet   . traZODone (DESYREL) 50 MG tablet Take 50 mg by mouth at bedtime.  . [DISCONTINUED] lisinopril-hydrochlorothiazide (PRINZIDE,ZESTORETIC) 20-12.5 MG per tablet   . [DISCONTINUED] benztropine (COGENTIN) 1 MG tablet Take 1 mg by mouth at bedtime.  . [DISCONTINUED] lurasidone (LATUDA) 40 MG TABS tablet Take 120 mg by mouth daily with breakfast.    No facility-administered encounter medications on file as of 03/12/2015.     PAST MEDICAL HISTORY: Past Medical History  Diagnosis Date  . Hypertension   . Asthma   . Hepatitis B infection   . Depression   . Bipolar 1 disorder   . Schizophrenia   . Ectopic pregnancy     PAST SURGICAL HISTORY: Past Surgical History  Procedure Laterality Date  . Diagnostic laparoscopy with removal of ectopic pregnancy      FAMILY HISTORY: Family History  Problem Relation Age of Onset  . Heart disease Mother   . Stroke Mother   . Asthma Mother   . Hypertension Father     SOCIAL HISTORY:  History   Social History  . Marital Status: Single    Spouse Name: N/A  . Number of Children: 0  .  Years of Education: HS   Occupational History  . DISABLE    Social History Main Topics  . Smoking status: Current Every Day Smoker -- 0.50 packs/day    Types: Cigarettes  . Smokeless tobacco: Never Used  . Alcohol Use: No  . Drug Use: No  . Sexual Activity: Yes   Other Topics Concern  . Not on file   Social History Narrative   Patient lives at home with her  fiancee Fayrene FearingJames   Caffeine Use: 1 cup daily     PHYSICAL EXAM  Filed Vitals:   03/12/15 1208  BP: 100/60  Pulse: 73  Height: 5\' 3"  (1.6 m)  Weight: 204 lb 4.8 oz (92.67 kg)    Not recorded      Body mass index is 36.2 kg/(m^2).  GENERAL EXAM: Patient is in no distress; ALERT, CALM, MILD RESTLESS MOVEMENTS; MILD ORAL DYSKINESIAS  CARDIOVASCULAR: Regular rate and rhythm, no murmurs, no carotid bruits  NEUROLOGIC: MENTAL STATUS: awake, alert, language fluent, comprehension intact, naming intact CRANIAL NERVE: pupils equal and reactive to light, visual fields full to confrontation, extraocular muscles intact, no nystagmus, facial sensation and strength symmetric, uvula midline, shoulder shrug symmetric, tongue midline. MOTOR: normal bulk and tone, full strength in the BUE, BLE SENSORY: normal and symmetric to light touch, temperature, vibration COORDINATION: finger-nose-finger, fine finger movements normal REFLEXES: deep tendon reflexes TRACE and symmetric GAIT/STATION: narrow based gait;     DIAGNOSTIC DATA (LABS, IMAGING, TESTING) - I reviewed patient records, labs, notes, testing and imaging myself where available.  Lab Results  Component Value Date   WBC 9.7 02/13/2015   HGB 13.3 02/13/2015   HCT 39.4 02/13/2015   MCV 95.4 02/13/2015   PLT 270 02/13/2015      Component Value Date/Time   NA 140 02/13/2015 1915   K 3.7 02/13/2015 1915   CL 106 02/13/2015 1915   CO2 25 02/13/2015 1915   GLUCOSE 131* 02/13/2015 1915   BUN 10 02/13/2015 1915   CREATININE 1.13* 02/13/2015 1915   CALCIUM 9.1 02/13/2015 1915   PROT 6.4 02/13/2015 1915   ALBUMIN 3.5 02/13/2015 1915   AST 22 02/13/2015 1915   ALT 9 02/13/2015 1915   ALKPHOS 63 02/13/2015 1915   BILITOT 0.3 02/13/2015 1915   GFRNONAA 60* 02/13/2015 1915   GFRAA 69* 02/13/2015 1915   No results found for: CHOL No results found for: HGBA1C Lab Results  Component Value Date   VITAMINB12 512 03/06/2014   Lab  Results  Component Value Date   TSH 0.715 06/12/2013     11/07/13 Equivocal MRI brain (without) demonstrating: 1. 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 No mass effect on tectal plate. No hydrocephalus. 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 Consider follow up study to ensure stability. 2. Remainder of brain parenchyma is normal.    ASSESSMENT AND PLAN  39 y.o. year old female here with schizoaffective disorder, hepatitis B, hypertension, here with cognitive decline and abnormal involuntary movements (tardive dyskinesia, akathisia) since 2014. This is likely related to underlying psychiatric disorders and medication side effects (haldol). Will defer medication mgmt to psychiatry. Also with incidental pineal cyst. Will repeat MRI to ensure stability.   PLAN:  Orders Placed This Encounter  Procedures  . MR Brain W Wo Contrast    Return in about 1 year (around 03/11/2016).    Suanne MarkerVIKRAM R. Melecio Cueto, MD 03/12/2015, 12:45 PM Certified in Neurology, Neurophysiology and Neuroimaging  [facility] [address], [address] [place], [place] [address] [phone]

## 2015-03-12 NOTE — Patient Instructions (Signed)
I will check MRI brain.  Continue current medications. 

## 2015-03-13 ENCOUNTER — Emergency Department (HOSPITAL_COMMUNITY)
Admission: EM | Admit: 2015-03-13 | Discharge: 2015-03-13 | Disposition: A | Discharge status: Home / Self Care | Payer: Medicare Other | Attending: Emergency Medicine | Admitting: Emergency Medicine

## 2015-03-13 ENCOUNTER — Encounter (HOSPITAL_COMMUNITY): Payer: Self-pay | Admitting: *Deleted

## 2015-03-13 ENCOUNTER — Emergency Department (HOSPITAL_COMMUNITY): Payer: Medicare Other

## 2015-03-13 DIAGNOSIS — Z8619 Personal history of other infectious and parasitic diseases: Secondary | ICD-10-CM | POA: Insufficient documentation

## 2015-03-13 DIAGNOSIS — R079 Chest pain, unspecified: Secondary | ICD-10-CM | POA: Diagnosis not present

## 2015-03-13 DIAGNOSIS — Z79899 Other long term (current) drug therapy: Secondary | ICD-10-CM | POA: Insufficient documentation

## 2015-03-13 DIAGNOSIS — I10 Essential (primary) hypertension: Secondary | ICD-10-CM | POA: Insufficient documentation

## 2015-03-13 DIAGNOSIS — R0602 Shortness of breath: Secondary | ICD-10-CM | POA: Diagnosis not present

## 2015-03-13 DIAGNOSIS — Z720 Tobacco use: Secondary | ICD-10-CM | POA: Insufficient documentation

## 2015-03-13 DIAGNOSIS — F319 Bipolar disorder, unspecified: Secondary | ICD-10-CM | POA: Diagnosis not present

## 2015-03-13 DIAGNOSIS — R071 Chest pain on breathing: Secondary | ICD-10-CM | POA: Diagnosis not present

## 2015-03-13 DIAGNOSIS — J45909 Unspecified asthma, uncomplicated: Secondary | ICD-10-CM | POA: Diagnosis not present

## 2015-03-13 DIAGNOSIS — J45901 Unspecified asthma with (acute) exacerbation: Secondary | ICD-10-CM | POA: Insufficient documentation

## 2015-03-13 DIAGNOSIS — F17210 Nicotine dependence, cigarettes, uncomplicated: Secondary | ICD-10-CM | POA: Diagnosis not present

## 2015-03-13 LAB — BASIC METABOLIC PANEL
Anion gap: 11 (ref 5–15)
BUN: 12 mg/dL (ref 6–20)
CO2: 21 mmol/L — ABNORMAL LOW (ref 22–32)
CREATININE: 0.96 mg/dL (ref 0.44–1.00)
Calcium: 8.8 mg/dL — ABNORMAL LOW (ref 8.9–10.3)
Chloride: 104 mmol/L (ref 101–111)
GFR calc Af Amer: >60 mL/min (ref >60)
GLUCOSE: 110 mg/dL — AB (ref 65–99)
POTASSIUM: 3.6 mmol/L (ref 3.5–5.1)
Sodium: 136 mmol/L (ref 135–145)

## 2015-03-13 LAB — CBC
HEMATOCRIT: 39.8 % (ref 36.0–46.0)
Hemoglobin: 13.3 g/dL (ref 12.0–15.0)
MCH: 31.7 pg (ref 26.0–34.0)
MCHC: 33.4 g/dL (ref 30.0–36.0)
MCV: 95 fL (ref 78.0–100.0)
Platelets: 241 10*3/uL (ref 150–400)
RBC: 4.19 MIL/uL (ref 3.87–5.11)
RDW: 13.3 % (ref 11.5–15.5)
WBC: 8.7 10*3/uL (ref 4.0–10.5)

## 2015-03-13 LAB — I-STAT TROPONIN, ED
TROPONIN I, POC: 0 ng/mL (ref 0.00–0.08)
TROPONIN I, POC: 0.01 ng/mL (ref 0.00–0.08)

## 2015-03-13 LAB — BRAIN NATRIURETIC PEPTIDE: B Natriuretic Peptide: 27.1 pg/mL (ref 0.0–100.0)

## 2015-03-13 MED ORDER — ALBUTEROL SULFATE HFA 108 (90 BASE) MCG/ACT IN AERS: 1.0000 | INHALATION_SPRAY | Freq: Four times a day (QID) | RESPIRATORY_TRACT | Status: DC | PRN

## 2015-03-13 MED ORDER — IPRATROPIUM-ALBUTEROL 0.5-2.5 (3) MG/3ML IN SOLN
3.0000 mL | Freq: Once | RESPIRATORY_TRACT | Status: AC
  Administered 2015-03-13: 3 mL via RESPIRATORY_TRACT
  Filled 2015-03-13: qty 3

## 2015-03-13 MED ORDER — PREDNISONE 50 MG PO TABS: ORAL_TABLET | ORAL | Status: DC

## 2015-03-13 MED ORDER — PREDNISONE 20 MG PO TABS
60.0000 mg | ORAL_TABLET | Freq: Once | ORAL | Status: AC
  Administered 2015-03-13: 60 mg via ORAL
  Filled 2015-03-13: qty 3

## 2015-03-13 NOTE — ED Notes (Signed)
Dr. Wickline at bedside.  

## 2015-03-13 NOTE — ED Notes (Signed)
Patient ambulated over 200 ft with this RN as supervisor, no complaints of shortness of breath. o2 sats consistantly at 100% on room air, heart rate 86-89.

## 2015-03-13 NOTE — Discharge Instructions (Signed)

## 2015-03-13 NOTE — ED Notes (Signed)
Pt c/o SOB and chest pain all day today. Reports hx of asthma.

## 2015-03-13 NOTE — ED Provider Notes (Signed)
CSN: [id number]     Arrival date & time 03/13/15  0036 History  This chart was scribed for Denise Rhineonald Nahuel Wilbert, MD by [name], ED Scribe. This patient was seen in room B16C/B16C and the patient's care was started at 1:51 AM.    Chief Complaint  Patient presents with  . Shortness of Breath  . Chest Pain   Patient is a 39 y.o. female presenting with shortness of breath and chest pain. The history is provided by the patient. No language interpreter was used.  Shortness of Breath Severity:  Moderate Onset quality:  Gradual Duration:  1 day Timing:  Constant Progression:  Unchanged Chronicity:  New Relieved by:  Nothing Worsened by:  Deep breathing Ineffective treatments: OTC cough and cold medications. Associated symptoms: chest pain, cough and wheezing   Associated symptoms: no abdominal pain, no fever, no hemoptysis, no sore throat and no vomiting   Chest pain:    Quality:  Pressure   Duration:  1 day   Timing:  Constant   Progression:  Unchanged   Chronicity:  New Risk factors: tobacco use   Risk factors: no hx of PE/DVT, no oral contraceptive use, no prolonged immobilization and no recent surgery   Chest Pain Associated symptoms: cough and shortness of breath   Associated symptoms: no abdominal pain, no fever and not vomiting      HPI Comments: [name] is a 39 y.o. female with past medical history of HTN, HLD, asthma who presents to the Emergency Department complaining of 1 day of generalized chest pain, described as pressure that is exacerbated with deep breathing. She also notes cough with SOB with wheezing. Patient reports she took some "cough and cold medications.' She denies fever, nasal congestion, sore throat, hemoptysis, abdominal pain, diarrhea, vomiting. She denies prior MI, stroke, blood clot in the legs or lungs. She denies recent travel or surgery, denies birth control or hormone therapy.   She is a .5ppd smoker.   Past Medical History  Diagnosis Date  .  Hypertension   . Asthma   . Hepatitis B infection   . Depression   . Bipolar 1 disorder   . Schizophrenia   . Ectopic pregnancy    Past Surgical History  Procedure Laterality Date  . Diagnostic laparoscopy with removal of ectopic pregnancy     Family History  Problem Relation Age of Onset  . Heart disease Mother   . Stroke Mother   . Asthma Mother   . Hypertension Father    History  Substance Use Topics  . Smoking status: Current Every Day Smoker -- 0.50 packs/day    Types: Cigarettes  . Smokeless tobacco: Never Used  . Alcohol Use: No   OB History    No data available     Review of Systems  Constitutional: Negative for fever.  HENT: Negative for sore throat.   Respiratory: Positive for cough, shortness of breath and wheezing. Negative for hemoptysis.   Cardiovascular: Positive for chest pain.  Gastrointestinal: Negative for vomiting and abdominal pain.  All other systems reviewed and are negative.   Allergies  Tomato and Tylenol  Home Medications   Prior to Admission medications   Medication Sig Start Date End Date Taking? Authorizing Provider  FLUoxetine (PROZAC) 20 MG capsule Take 20 mg by mouth daily. 02/21/15   Historical Provider, MD  haloperidol (HALDOL) 5 MG tablet  03/06/15   Historical Provider, MD  hydrOXYzine (ATARAX/VISTARIL) 50 MG tablet Take 50 mg by mouth at bedtime.  Historical Provider, MD  meloxicam (MOBIC) 15 MG tablet  12/30/14   Historical Provider, MD  traZODone (DESYREL) 50 MG tablet Take 50 mg by mouth at bedtime. 02/21/15   Historical Provider, MD   BP 115/66 mmHg  Pulse 82  Temp(Src) 98 F (36.7 C) (Oral)  Resp 26  Ht  (1.626 m)  Wt 204 lb (92.534 kg)  BMI 35.00 kg/m2  SpO2 97% Physical Exam  Nursing note and vitals reviewed.   CONSTITUTIONAL: Well developed/well nourished HEAD: Normocephalic/atraumatic EYES: EOMI/PERRL ENMT: Mucous membranes moist NECK: supple no meningeal signs SPINE/BACK:entire spine nontender CV:  S1/S2 noted, no murmurs/rubs/gallops noted LUNGS: Wheezing bilaterally, no distress noted ABDOMEN: soft, nontender, no rebound or guarding, bowel sounds noted throughout abdomen GU:no cva tenderness NEURO: Pt is awake/alert/appropriate, moves all extremitiesx4.  No facial droop.   EXTREMITIES: pulses normal/equal, full ROM; no edema, no calf tenderness SKIN: warm, color normal PSYCH: anxious  ED Course  Procedures   DIAGNOSTIC STUDIES: Oxygen Saturation is 97% on RA, adequate by my interpretation.    COORDINATION OF CARE: 1:54 AM Discussed treatment plan with pt at bedside and pt agreed to plan.  3:38 AM Pt improved Wheezing improved No hypoxia No tachypnea (has RR >40, but suspect this is error as she is in no distress) Will check repeat troponin at 3rd hours  HEART score 3 She appears PERC negative at this time  4:52 AM Pt improved Wheezing improved Ambulatory without hypoxia and without tachycardia I have low suspicion for ACS/PE at this time Will treat for asthma BP 101/64 mmHg  Pulse 77  Temp(Src) 98 F (36.7 C) (Oral)  Resp 21  Ht  (1.626 m)  Wt 204 lb (92.534 kg)  BMI 35.00 kg/m2  SpO2 100%   Labs Review Labs Reviewed  BASIC METABOLIC PANEL - Abnormal; Notable for the following:    CO2 21 (*)    Glucose, Bld 110 (*)    Calcium 8.8 (*)    All other components within normal limits  CBC  BRAIN NATRIURETIC PEPTIDE  I-STAT TROPOININ, ED  [name], ED    Imaging Review Dg Chest 2 View  03/13/2015   CLINICAL DATA:  Acute onset of shortness of breath and chest pain. Initial encounter.  EXAM: CHEST  2 VIEW  COMPARISON:  Chest radiograph performed 01/29/2015  FINDINGS: The lungs are well-aerated and clear. There is no evidence of focal opacification, pleural effusion or pneumothorax.  The heart is normal in size; the mediastinal contour is within normal limits. No acute osseous abnormalities are seen.  IMPRESSION: No acute cardiopulmonary process  seen.   Electronically Signed   By: [name] M.D.   On: 03/13/2015 02:21     EKG Interpretation   Date/Time:  Thursday Mar 13 2015 00:49:17 EDT Ventricular Rate:  84 PR Interval:  126 QRS Duration: 82 QT Interval:  372 QTC Calculation: 439 R Axis:   67 Text Interpretation:  Normal sinus rhythm Biatrial enlargement Cannot rule  out Anterior infarct , age undetermined Abnormal ECG Confirmed by Denise Shaggy   MD, [name] ([id number]) on 03/13/2015 1:40:56 AM     Medications  ipratropium-albuterol (DUONEB) 0.5-2.5 (3) MG/3ML nebulizer solution 3 mL (3 mLs Nebulization Given 03/13/15 0202)  predniSONE (DELTASONE) tablet 60 mg (60 mg Oral Given 03/13/15 0202)     EKG Interpretation  Date/Time:  Thursday Mar 13 2015 04:40:09 EDT Ventricular Rate:  65 PR Interval:  154 QRS Duration: 76 QT Interval:  400 QTC Calculation: 416 R  Axis:   69 Text Interpretation:  Sinus rhythm LAE, consider biatrial enlargement Borderline T wave abnormalities No significant change since last tracing Confirmed by Denise ShaggyWICKLINE  MD, [name] ([id number]) on 03/13/2015 4:46:14 AM         MDM   Final diagnoses:  Asthma attack  Chest pain, unspecified chest pain type    Nursing notes including past medical history and social history reviewed and considered in documentation Labs/vital reviewed myself and considered during evaluation xrays/imaging reviewed by myself and considered during evaluation   I personally performed the services described in this documentation, which was scribed in my presence. The recorded information has been reviewed and is accurate.      Denise Rhineonald Emara Lichter, MD 03/13/15 [phone]

## 2015-04-04 ENCOUNTER — Inpatient Hospital Stay: Admission: RE | Admit: 2015-04-04 | Payer: Medicare Other | Source: Ambulatory Visit

## 2015-04-22 ENCOUNTER — Inpatient Hospital Stay: Admission: RE | Admit: 2015-04-22 | Payer: Medicare Other | Source: Ambulatory Visit

## 2015-05-02 ENCOUNTER — Ambulatory Visit
Admission: RE | Admit: 2015-05-02 | Discharge: 2015-05-02 | Disposition: A | Discharge status: Home / Self Care | Payer: Medicare Other | Source: Ambulatory Visit | Attending: Diagnostic Neuroimaging | Admitting: Diagnostic Neuroimaging

## 2015-05-02 DIAGNOSIS — G2571 Drug induced akathisia: Secondary | ICD-10-CM

## 2015-05-02 DIAGNOSIS — R259 Unspecified abnormal involuntary movements: Secondary | ICD-10-CM

## 2015-05-02 DIAGNOSIS — R450 Nervousness: Secondary | ICD-10-CM | POA: Diagnosis not present

## 2015-05-02 DIAGNOSIS — E348 Other specified endocrine disorders: Secondary | ICD-10-CM | POA: Diagnosis not present

## 2015-05-02 MED ORDER — GADOBENATE DIMEGLUMINE 529 MG/ML IV SOLN
19.0000 mL | Freq: Once | INTRAVENOUS | Status: AC | PRN
  Administered 2015-05-02: 19 mL via INTRAVENOUS

## 2016-01-02 ENCOUNTER — Emergency Department (HOSPITAL_COMMUNITY)
Admission: EM | Admit: 2016-01-02 | Discharge: 2016-01-02 | Disposition: A | Discharge status: Home / Self Care | Payer: Medicare Other | Attending: Emergency Medicine | Admitting: Emergency Medicine

## 2016-01-02 ENCOUNTER — Encounter (HOSPITAL_COMMUNITY): Payer: Self-pay | Admitting: Emergency Medicine

## 2016-01-02 DIAGNOSIS — R030 Elevated blood-pressure reading, without diagnosis of hypertension: Secondary | ICD-10-CM | POA: Insufficient documentation

## 2016-01-02 DIAGNOSIS — F329 Major depressive disorder, single episode, unspecified: Secondary | ICD-10-CM | POA: Diagnosis not present

## 2016-01-02 DIAGNOSIS — Z79899 Other long term (current) drug therapy: Secondary | ICD-10-CM | POA: Diagnosis not present

## 2016-01-02 DIAGNOSIS — IMO0001 Reserved for inherently not codable concepts without codable children: Secondary | ICD-10-CM

## 2016-01-02 DIAGNOSIS — F17210 Nicotine dependence, cigarettes, uncomplicated: Secondary | ICD-10-CM | POA: Insufficient documentation

## 2016-01-02 DIAGNOSIS — J45909 Unspecified asthma, uncomplicated: Secondary | ICD-10-CM | POA: Diagnosis not present

## 2016-01-02 DIAGNOSIS — Z79811 Long term (current) use of aromatase inhibitors: Secondary | ICD-10-CM | POA: Insufficient documentation

## 2016-01-02 DIAGNOSIS — F319 Bipolar disorder, unspecified: Secondary | ICD-10-CM | POA: Insufficient documentation

## 2016-01-02 DIAGNOSIS — I10 Essential (primary) hypertension: Secondary | ICD-10-CM | POA: Diagnosis not present

## 2016-01-02 NOTE — ED Notes (Signed)
PT states [facility] due to high blood pressure readings in the office this am. PT denies any complaints and denies any hx of HTN. PT B/P in triage 121/60.

## 2016-01-02 NOTE — Discharge Instructions (Signed)
Hypertension Hypertension, commonly called high blood pressure, is when the force of blood pumping through your arteries is too strong. Your arteries are the blood vessels that carry blood from your heart throughout your body. A blood pressure reading consists of a higher number over a lower number, such as 110/72. The higher number (systolic) is the pressure inside your arteries when your heart pumps. The lower number (diastolic) is the pressure inside your arteries when your heart relaxes. Ideally you want your blood pressure below 120/80. Hypertension forces your heart to work harder to pump blood. Your arteries may become narrow or stiff. Having untreated or uncontrolled hypertension can cause heart attack, stroke, kidney disease, and other problems. RISK FACTORS Some risk factors for high blood pressure are controllable. Others are not.  Risk factors you cannot control include:   Race. You may be at higher risk if you are African American.  Age. Risk increases with age.  Gender. Men are at higher risk than women before age 45 years. After age 65, women are at higher risk than men. Risk factors you can control include:  Not getting enough exercise or physical activity.  Being overweight.  Getting too much fat, sugar, calories, or salt in your diet.  Drinking too much alcohol. SIGNS AND SYMPTOMS Hypertension does not usually cause signs or symptoms. Extremely high blood pressure (hypertensive crisis) may cause headache, anxiety, shortness of breath, and nosebleed. DIAGNOSIS To check if you have hypertension, your health care provider will measure your blood pressure while you are seated, with your arm held at the level of your heart. It should be measured at least twice using the same arm. Certain conditions can cause a difference in blood pressure between your right and left arms. A blood pressure reading that is higher than normal on one occasion does not mean that you need treatment. If  it is not clear whether you have high blood pressure, you may be asked to return on a different day to have your blood pressure checked again. Or, you may be asked to monitor your blood pressure at home for 1 or more weeks. TREATMENT Treating high blood pressure includes making lifestyle changes and possibly taking medicine. Living a healthy lifestyle can help lower high blood pressure. You may need to change some of your habits. Lifestyle changes may include:  Following the DASH diet. This diet is high in fruits, vegetables, and whole grains. It is low in salt, red meat, and added sugars.  Keep your sodium intake below 2,300 mg per day.  Getting at least 30-45 minutes of aerobic exercise at least 4 times per week.  Losing weight if necessary.  Not smoking.  Limiting alcoholic beverages.  Learning ways to reduce stress. Your health care provider may prescribe medicine if lifestyle changes are not enough to get your blood pressure under control, and if one of the following is true:  You are 18-59 years of age and your systolic blood pressure is above 140.  You are 60 years of age or older, and your systolic blood pressure is above 150.  Your diastolic blood pressure is above 90.  You have diabetes, and your systolic blood pressure is over 140 or your diastolic blood pressure is over 90.  You have kidney disease and your blood pressure is above 140/90.  You have heart disease and your blood pressure is above 140/90. Your personal target blood pressure may vary depending on your medical conditions, your age, and other factors. HOME CARE INSTRUCTIONS    Have your blood pressure rechecked as directed by your health care provider.   Take medicines only as directed by your health care provider. Follow the directions carefully. Blood pressure medicines must be taken as prescribed. The medicine does not work as well when you skip doses. Skipping doses also puts you at risk for  problems.  Do not smoke.   Monitor your blood pressure at home as directed by your health care provider. SEEK MEDICAL CARE IF:   You think you are having a reaction to medicines taken.  You have recurrent headaches or feel dizzy.  You have swelling in your ankles.  You have trouble with your vision. SEEK IMMEDIATE MEDICAL CARE IF:  You develop a severe headache or confusion.  You have unusual weakness, numbness, or feel faint.  You have severe chest or abdominal pain.  You vomit repeatedly.  You have trouble breathing. MAKE SURE YOU:   Understand these instructions.  Will watch your condition.  Will get help right away if you are not doing well or get worse.   This information is not intended to replace advice given to you by your health care provider. Make sure you discuss any questions you have with your health care provider.   Document Released: 10/11/2005 Document Revised: 02/25/2015 Document Reviewed: 08/03/2013 Elsevier Interactive Patient Education 2016 [employer]    Your blood pressure today was checked twice while here and was 121/60, then 142/87.  There is no indication today for further evaluation or testing of your blood pressures and it is safe for you to be seen by your psychiatrist.

## 2016-01-02 NOTE — ED Provider Notes (Signed)
CSN: [id number]     Arrival date & time 01/02/16  [id number] History  By signing my name below, I, Marica Otterusrat Rahman, attest that this documentation has been prepared under the direction and in the presence of Burgess AmorJulie Shunna Mikaelian, PA-C. Electronically Signed: Marica OtterNusrat Rahman, ED Scribe. 01/02/2016. 11:19 AM.  Chief Complaint  Patient presents with  . Hypertension   The history is provided by the patient. No language interpreter was used.   PCP: No PCP per Pt  HPI Comments: [name] is a 40 y.o. female, with PMHx noted below including HTN (pt reports she was taken off BP meds by former PCP, Dr. Concepcion ElkAvbuere, one year ago), who presents to the Emergency Department complaining of elevated BP onset this morning. Pt reports she [facility]. Pt reports she is asymptomatic during exam. At triage pt's BP was 142/87. Pt denies headache, changes in vision, chest pain, SOB, swelling of ankles, or any other Sx at this time.   Past Medical History  Diagnosis Date  . Hypertension   . Asthma   . Hepatitis B infection   . Depression   . Bipolar 1 disorder (HCC)   . Schizophrenia (HCC)   . Ectopic pregnancy    Past Surgical History  Procedure Laterality Date  . Diagnostic laparoscopy with removal of ectopic pregnancy     Family History  Problem Relation Age of Onset  . Heart disease Mother   . Stroke Mother   . Asthma Mother   . Hypertension Father    Social History  Substance Use Topics  . Smoking status: Current Every Day Smoker -- 0.25 packs/day    Types: Cigarettes  . Smokeless tobacco: Never Used  . Alcohol Use: No   OB History    Gravida Para Term Preterm AB TAB SAB Ectopic Multiple Living   1    1   1        Review of Systems  Constitutional: Negative for appetite change and fatigue.  HENT: Negative for congestion, ear discharge and sinus pressure.   Eyes: Negative for discharge and visual disturbance.  Respiratory: Negative for cough and shortness of breath.    Cardiovascular: Negative for chest pain and leg swelling.  Gastrointestinal: Negative for diarrhea.  Genitourinary: Negative for frequency and hematuria.  Musculoskeletal: Negative for back pain.  Skin: Negative for rash.  Neurological: Negative for seizures and headaches.  Psychiatric/Behavioral: Negative for hallucinations.      Allergies  Tomato and Tylenol  Home Medications   Prior to Admission medications   Medication Sig Start Date End Date Taking? Authorizing Provider  albuterol (PROVENTIL HFA;VENTOLIN HFA) 108 (90 BASE) MCG/ACT inhaler Inhale 1-2 puffs into the lungs every 6 (six) hours as needed for wheezing or shortness of breath. 03/13/15  Yes Zadie Rhineonald Wickline, MD  benztropine (COGENTIN) 0.5 MG tablet Take 0.5 mg by mouth daily.   Yes Historical Provider, MD  FLUoxetine (PROZAC) 20 MG capsule Take 20 mg by mouth daily. 02/21/15  Yes Historical Provider, MD  haloperidol (HALDOL) 5 MG tablet Take 5 mg by mouth at bedtime.  03/06/15  Yes Historical Provider, MD  hydrOXYzine (ATARAX/VISTARIL) 50 MG tablet Take 50 mg by mouth at bedtime.   Yes Historical Provider, MD  traZODone (DESYREL) 50 MG tablet Take 50 mg by mouth at bedtime. 02/21/15  Yes Historical Provider, MD   Triage Vitals: BP 142/87 mmHg  Pulse 80  Temp(Src) 98.3 F (36.8 C) (Oral)  Resp 18  Ht   (1.626 m)  Wt 217 lb (98.431 kg)  BMI 37.23 kg/m2  SpO2 97% Physical Exam  Constitutional: She is oriented to person, place, and time. She appears well-developed and well-nourished. No distress.  HENT:  Head: Normocephalic and atraumatic.  Eyes: EOM are normal.  Neck: Normal range of motion.  Cardiovascular: Normal rate, regular rhythm and normal heart sounds.   Pulmonary/Chest: Effort normal and breath sounds normal.  Musculoskeletal: Normal range of motion. She exhibits no edema.  Neurological: She is alert and oriented to person, place, and time.  Chronic tremors consistent with known Dx of tardive  dyskinesia.   Skin: Skin is warm and dry.  Psychiatric: She has a normal mood and affect. Judgment normal.  Nursing note and vitals reviewed.   ED Course  Procedures (including critical care time) DIAGNOSTIC STUDIES: Oxygen Saturation is 97% on ra, nl by my interpretation.    COORDINATION OF CARE: 11:12 AM: Discussed treatment plan with pt at bedside; patient verbalizes understanding and agrees with treatment plan.    MDM   Final diagnoses:  Elevated blood pressure    Blood pressure checked x 3 while here, only one borderline elevation at 142/87.  No other eval necessary here. Pt without sx.  Prn f/u anticipated.  I personally performed the services described in this documentation, which was scribed in my presence. The recorded information has been reviewed and is accurate.   Burgess Amor, PA-C 01/04/16 2048  Samuel Jester, DO 01/05/16 [phone]

## 2016-03-04 DIAGNOSIS — F251 Schizoaffective disorder, depressive type: Secondary | ICD-10-CM | POA: Diagnosis not present

## 2016-03-15 ENCOUNTER — Ambulatory Visit: Payer: Medicare Other | Admitting: Diagnostic Neuroimaging

## 2016-06-21 DIAGNOSIS — F251 Schizoaffective disorder, depressive type: Secondary | ICD-10-CM | POA: Diagnosis not present

## 2016-10-14 DIAGNOSIS — F251 Schizoaffective disorder, depressive type: Secondary | ICD-10-CM | POA: Diagnosis not present

## 2017-02-10 DIAGNOSIS — F251 Schizoaffective disorder, depressive type: Secondary | ICD-10-CM | POA: Diagnosis not present

## 2017-03-28 ENCOUNTER — Encounter: Payer: Self-pay | Admitting: Podiatry

## 2017-03-28 ENCOUNTER — Ambulatory Visit (INDEPENDENT_AMBULATORY_CARE_PROVIDER_SITE_OTHER): Payer: Medicare Other | Admitting: Podiatry

## 2017-03-28 VITALS — BP 132/72 | HR 79 | Resp 16 | Ht 64.0 in | Wt 200.0 lb

## 2017-03-28 DIAGNOSIS — L600 Ingrowing nail: Secondary | ICD-10-CM

## 2017-03-28 NOTE — Patient Instructions (Addendum)

## 2017-03-28 NOTE — Progress Notes (Signed)
   Subjective:    Patient ID: Denise LeuAnita G Darius, female    DOB: February 15, 1976, 41 y.o.   MRN: [id number]  HPI Chief Complaint  Patient presents with  . Debridement    Bilateral nail trim  . Nail Problem    Bilateral; nail discoloration & thickened nails; all nails; pt stated, "Has had for a long time"      Review of Systems  Constitutional: Positive for fatigue.  HENT: Positive for tinnitus.   Endocrine: Positive for polydipsia.  Allergic/Immunologic: Positive for food allergies.  Psychiatric/Behavioral: Positive for behavioral problems and confusion.  All other systems reviewed and are negative.      Objective:   Physical Exam        Assessment & Plan:

## 2017-03-28 NOTE — Progress Notes (Signed)
Subjective:    Patient ID: [name], female   DOB: 41 y.o.   MRN: [id number]   HPI patient presents with severe damage to the hallux and second nails of both feet stating that they're very painful and impossible to cut and they are impossible to wear shoe gear with    Review of Systems  All other systems reviewed and are negative.       Objective:  Physical Exam  Constitutional: She is oriented to person, place, and time.  Cardiovascular: Intact distal pulses.   Musculoskeletal: Normal range of motion.  Neurological: She is alert and oriented to person, place, and time.  Skin: Skin is warm and dry.  Nursing note and vitals reviewed.  neurovascular status found to be intact with muscle strength adequate range of motion within normal limits and patient noted to have severely thickened incurvated nailbeds 1 and 2 of both feet that are painful when palpated with the right being slightly worse than the left. Patient's found have good digital perfusion and is well oriented 3     Assessment:   Severe nail disease with dystrophic changes of the hallux and second nails bilateral that are painful when palpated      Plan:    H&P conditions reviewed and discussed treatment options. She wants nail removal and I explained procedures and risk and today we'll focus on the right were with a leftward to be done in 2 weeks. I infiltrated the hallux and second digits with 120 mg Xylocaine Marcaine mixture removing the hallux and second nails exposing matrix and applying phenol 5 applications phenol followed by alcohol lavaged sterile dressing. Gave instructions on soaks and reappoint

## 2017-04-11 ENCOUNTER — Ambulatory Visit: Payer: Medicare Other | Admitting: Podiatry

## 2017-04-15 ENCOUNTER — Encounter: Payer: Self-pay | Admitting: Podiatry

## 2017-04-15 ENCOUNTER — Ambulatory Visit (INDEPENDENT_AMBULATORY_CARE_PROVIDER_SITE_OTHER): Payer: Medicare Other | Admitting: Podiatry

## 2017-04-15 DIAGNOSIS — L600 Ingrowing nail: Secondary | ICD-10-CM | POA: Diagnosis not present

## 2017-04-18 NOTE — Progress Notes (Signed)
Subjective:    Patient ID: [name], female   DOB: 41 y.o.   MRN: [id number]   HPI patient states she's doing well with her big toenail and second toenail removal right but she wants to hold off on the left due to her schedule but she needs to have them trimmed due to excessive length    ROS      Objective:  Physical Exam neurovascular status intact with crusted nailbeds hallux and second nails bilateral with no proximal edema erythema or drainage noted. Severe elongation thickness of nailbeds 1 to left     Assessment:  Chronic ingrown mycotic nail infection hallux and second nails left with well-healing surgical site right       Plan:   H&P condition reviewed and recommended at one point nail removal left but at this time deep debridement of nails done and I educated her on the procedure again. Patient's right she will continue to use Band-Aids continue soaking until all drainage has and it but they are healing very well

## 2017-06-02 DIAGNOSIS — F251 Schizoaffective disorder, depressive type: Secondary | ICD-10-CM | POA: Diagnosis not present

## 2017-06-03 ENCOUNTER — Encounter: Payer: Self-pay | Admitting: Family Medicine

## 2017-06-03 ENCOUNTER — Ambulatory Visit (INDEPENDENT_AMBULATORY_CARE_PROVIDER_SITE_OTHER): Payer: Medicare Other | Admitting: Family Medicine

## 2017-06-03 VITALS — BP 136/78 | HR 84 | Temp 97.8°F | Resp 20 | Ht 64.0 in | Wt 245.1 lb

## 2017-06-03 DIAGNOSIS — J4520 Mild intermittent asthma, uncomplicated: Secondary | ICD-10-CM | POA: Insufficient documentation

## 2017-06-03 DIAGNOSIS — Z720 Tobacco use: Secondary | ICD-10-CM

## 2017-06-03 DIAGNOSIS — E559 Vitamin D deficiency, unspecified: Secondary | ICD-10-CM | POA: Diagnosis not present

## 2017-06-03 DIAGNOSIS — F259 Schizoaffective disorder, unspecified: Secondary | ICD-10-CM

## 2017-06-03 DIAGNOSIS — Z6841 Body Mass Index (BMI) 40.0 and over, adult: Secondary | ICD-10-CM

## 2017-06-03 DIAGNOSIS — Z131 Encounter for screening for diabetes mellitus: Secondary | ICD-10-CM

## 2017-06-03 DIAGNOSIS — E66813 Obesity, class 3: Secondary | ICD-10-CM

## 2017-06-03 LAB — CBC
HCT: 44.6 % (ref 35.0–45.0)
Hemoglobin: 14.7 g/dL (ref 11.7–15.5)
MCH: 29.3 pg (ref 27.0–33.0)
MCHC: 33 g/dL (ref 32.0–36.0)
MCV: 89 fL (ref 80.0–100.0)
MPV: 11.5 fL (ref 7.5–12.5)
PLATELETS: 266 10*3/uL (ref 140–400)
RBC: 5.01 MIL/uL (ref 3.80–5.10)
RDW: 15.2 % — ABNORMAL HIGH (ref 11.0–15.0)
WBC: 9.7 10*3/uL (ref 3.8–10.8)

## 2017-06-03 NOTE — Progress Notes (Signed)
Chief Complaint  Patient presents with  . Follow-up    est   New to establish Has not seen a PCP for 3 y since moving [place]  [place] She [facility] her [facility].  Compliant with visits and medicine.  Feels stable.   She is here by herself and says she has hypertension - but is not on BP medicine and her pressure is normal,  She says she has "mild diabetes" but is not on medicine and does not know diabetic diet basics, she has cognitive impairment and is a poor historian.  Will get updated labs. Has a "fiance" in [place]  Lives at home with parents Does not drive Spends her day "watching TV" No exercise Is upset at her weight. I have discussed the multiple health risks associated with cigarette smoking including, but not limited to, cardiovascular disease, lung disease and cancer.  I have strongly recommended that smoking be stopped.  I have reviewed the various methods of quitting including cold [place], classes, nicotine replacements and prescription medications.  I have offered assistance in this difficult process.  The patient is not interested in assistance at this time.  Offered nicotine patches only due to psych illness.   Patient Active Problem List   Diagnosis Date Noted  . Mild intermittent asthma without complication 06/03/2017  . Tobacco abuse 06/03/2017  . Schizoaffective disorder (HCC) 07/02/2013  . Mild cognitive impairment 07/02/2013  . Amenorrhea 06/12/2013  . Obesity 06/12/2013    Outpatient Encounter Prescriptions as of 06/03/2017  Medication Sig  . albuterol (PROVENTIL HFA;VENTOLIN HFA) 108 (90 BASE) MCG/ACT inhaler Inhale 1-2 puffs into the lungs every 6 (six) hours as needed for wheezing or shortness of breath.  . benztropine (COGENTIN) 0.5 MG tablet Take 0.5 mg by mouth daily.  Marland Kitchen. FLUoxetine (PROZAC) 20 MG capsule Take 20 mg by mouth daily.  . haloperidol (HALDOL) 5 MG tablet Take 5 mg by mouth at bedtime.   . hydrOXYzine  (ATARAX/VISTARIL) 50 MG tablet Take 50 mg by mouth at bedtime.  . traZODone (DESYREL) 50 MG tablet Take 50 mg by mouth at bedtime.   No facility-administered encounter medications on file as of 06/03/2017.     Past Medical History:  Diagnosis Date  . Allergy   . Anxiety   . Arthritis   . Asthma   . Bipolar 1 disorder (HCC)   . Depression   . Ectopic pregnancy   . Hepatitis B infection   . Hyperlipidemia   . Hypertension   . Schizophrenia [facility](HCC)     Past Surgical History:  Procedure Laterality Date  . DIAGNOSTIC LAPAROSCOPY WITH REMOVAL OF ECTOPIC PREGNANCY      Social History   Social History  . Marital status: Single    Spouse name: N/A  . Number of children: 0  . Years of education: HS   Occupational History  . DISABLED Unemployed   Social History Main Topics  . Smoking status: Current Every Day Smoker    Packs/day: 0.25    Types: Cigarettes    Start date: 10/25/2005  . Smokeless tobacco: Never Used  . Alcohol use No  . Drug use: No     Comment: 4 years ago  . Sexual activity: Yes    Birth control/ protection: None   Other Topics Concern  . Not on file   Social History Narrative   Patient lives at home with her mother and father   Liliane ShiLikes to watch TV  Does not drive    Family History  Problem Relation Age of Onset  . Heart disease Mother   . Stroke Mother   . Asthma Mother   . Arthritis Mother   . Depression Mother   . Diabetes Mother   . Hyperlipidemia Mother   . Hypertension Mother   . Hypertension Father   . Hyperlipidemia Father     Review of Systems  Constitutional: Negative for chills, fever and weight loss.       Weight gain  HENT: Negative for congestion and hearing loss.   Eyes: Negative for blurred vision and pain.  Respiratory: Negative for cough and shortness of breath.   Cardiovascular: Negative for chest pain and leg swelling.  Gastrointestinal: Negative for abdominal pain, constipation, diarrhea and heartburn.  Genitourinary:  Negative for dysuria and frequency.  Musculoskeletal: Negative for falls, joint pain and myalgias.  Neurological: Negative for dizziness, seizures and headaches.  Psychiatric/Behavioral: Negative for depression and hallucinations. The patient is not nervous/anxious and does not have insomnia.     BP 136/78 (BP Location: Right Arm, Patient Position: Sitting, Cuff Size: Large)   Pulse 84   Temp 97.8 F (36.6 C) (Temporal)   Resp 20   Ht 5\' 4"  (1.626 m)   Wt 245 lb 1.3 oz (111.2 kg)   SpO2 99%   BMI 42.07 kg/m   Physical Exam  Constitutional: She appears well-developed and well-nourished. No distress.  Poor eye contact.  Obese  HENT:  Head: Normocephalic and atraumatic.  Mouth/Throat: Oropharynx is clear and moist.  Eyes: Pupils are equal, round, and reactive to light. Conjunctivae are normal.  Cardiovascular: Normal rate, regular rhythm and normal heart sounds.   Pulmonary/Chest: Effort normal and breath sounds normal. She has no wheezes.  Musculoskeletal: Normal range of motion. She exhibits edema.  Neurological: She is alert. Coordination normal.  Psychiatric:  Poor fund of knowledge.  Slow responses.  Requires repetition  ASSESSMENT/PLAN:   1. Class 3 severe obesity due to excess calories without serious comorbidity with body mass index (BMI) of 40.0 to 44.9 in adult [facility])  2. Schizoaffective disorder, unspecified type (HCC)  3. Screening for diabetes mellitus  4. Vitamin D deficiency - VITAMIN D 25 Hydroxy (Vit-D Deficiency, Fractures) 5. Mild intermittent asthma  6. Tobacco abuse  Patient Instructions  Need blood work today I will send a report to Dr Geanie Cooley Walk every day that you are able  See me in a month Call sooner for problems  Need old records   Eustace Moore, MD

## 2017-06-03 NOTE — Patient Instructions (Addendum)
Need blood work today I will send a report to Dr Geanie CooleyLay Walk every day that you are able  See me in a month Call sooner for problems  Need old records

## 2017-06-04 LAB — URINALYSIS, ROUTINE W REFLEX MICROSCOPIC
Bilirubin Urine: NEGATIVE
Glucose, UA: NEGATIVE
HGB URINE DIPSTICK: NEGATIVE
Ketones, ur: NEGATIVE
NITRITE: POSITIVE — AB
PH: 6 (ref 5.0–8.0)
Protein, ur: NEGATIVE
Specific Gravity, Urine: 1.006 (ref 1.001–1.035)

## 2017-06-04 LAB — COMPLETE METABOLIC PANEL WITH GFR
ALT: 8 U/L (ref 6–29)
AST: 11 U/L (ref 10–30)
Albumin: 4 g/dL (ref 3.6–5.1)
Alkaline Phosphatase: 89 U/L (ref 33–115)
BUN: 5 mg/dL — ABNORMAL LOW (ref 7–25)
CO2: 27 mmol/L (ref 20–32)
Calcium: 9.4 mg/dL (ref 8.6–10.2)
Chloride: 102 mmol/L (ref 98–110)
Creat: 0.88 mg/dL (ref 0.50–1.10)
GFR, Est African American: >89 mL/min (ref >=60)
GFR, Est Non African American: 81 mL/min (ref >=60)
Glucose, Bld: 72 mg/dL (ref 65–99)
Potassium: 4.5 mmol/L (ref 3.5–5.3)
Sodium: 138 mmol/L (ref 135–146)
Total Bilirubin: 0.3 mg/dL (ref 0.2–1.2)
Total Protein: 7 g/dL (ref 6.1–8.1)

## 2017-06-04 LAB — LIPID PANEL
CHOL/HDL RATIO: 4.1 ratio (ref <5.0)
Cholesterol: 152 mg/dL (ref <200)
HDL: 37 mg/dL — ABNORMAL LOW (ref >50)
LDL CALC: 87 mg/dL (ref <100)
TRIGLYCERIDES: 140 mg/dL (ref <150)
VLDL: 28 mg/dL (ref <30)

## 2017-06-04 LAB — URINALYSIS, MICROSCOPIC ONLY
Casts: NONE SEEN [LPF]
Crystals: NONE SEEN [HPF]
WBC, UA: >60 WBC/HPF — AB (ref <=5)
Yeast: NONE SEEN [HPF]

## 2017-06-04 LAB — VITAMIN D 25 HYDROXY (VIT D DEFICIENCY, FRACTURES): Vit D, 25-Hydroxy: 8 ng/mL — ABNORMAL LOW (ref 30–100)

## 2017-06-07 ENCOUNTER — Encounter: Payer: Self-pay | Admitting: Family Medicine

## 2017-06-07 MED ORDER — VITAMIN D (ERGOCALCIFEROL) 1.25 MG (50000 UNIT) PO CAPS: 50000.0000 [IU] | ORAL_CAPSULE | ORAL | 0 refills | Status: DC

## 2017-06-21 ENCOUNTER — Encounter: Payer: Self-pay | Admitting: *Deleted

## 2017-07-05 ENCOUNTER — Ambulatory Visit: Payer: Medicare Other | Admitting: Family Medicine

## 2017-07-20 ENCOUNTER — Telehealth: Payer: Self-pay | Admitting: Family Medicine

## 2017-07-20 NOTE — Telephone Encounter (Signed)
Patient left message on voice mail  4:19 yesterday wanting to check on or possible reschedule appointment for 07-21-17. I returned call and left message on voice mail for her to call back.    cb  [phone]

## 2017-07-21 ENCOUNTER — Ambulatory Visit (INDEPENDENT_AMBULATORY_CARE_PROVIDER_SITE_OTHER): Payer: Medicare Other | Admitting: Family Medicine

## 2017-07-21 ENCOUNTER — Encounter: Payer: Self-pay | Admitting: Family Medicine

## 2017-07-21 VITALS — BP 100/60 | HR 75 | Ht 64.0 in | Wt 237.0 lb

## 2017-07-21 DIAGNOSIS — Z23 Encounter for immunization: Secondary | ICD-10-CM | POA: Diagnosis not present

## 2017-07-21 DIAGNOSIS — Z6841 Body Mass Index (BMI) 40.0 and over, adult: Secondary | ICD-10-CM | POA: Diagnosis not present

## 2017-07-21 DIAGNOSIS — Z720 Tobacco use: Secondary | ICD-10-CM

## 2017-07-21 DIAGNOSIS — E559 Vitamin D deficiency, unspecified: Secondary | ICD-10-CM | POA: Diagnosis not present

## 2017-07-21 NOTE — Progress Notes (Signed)
Chief Complaint  Patient presents with  . Weight Check  started waking Has lost  8 pounds Is congratulated Is compliant with medicine Labs reviewed She had vit  Deficiency She got her latter, but has not yet gotten her Rx.  Is advised to today Is reminded the health risks of smoking   Patient Active Problem List   Diagnosis Date Noted  . Mild intermittent asthma without complication 06/03/2017  . Tobacco abuse 06/03/2017  . Schizoaffective disorder (HCC) 07/02/2013  . Mild cognitive impairment 07/02/2013  . Amenorrhea 06/12/2013  . Obesity 06/12/2013    Outpatient Encounter Prescriptions as of 07/21/2017  Medication Sig  . albuterol (PROVENTIL HFA;VENTOLIN HFA) 108 (90 BASE) MCG/ACT inhaler Inhale 1-2 puffs into the lungs every 6 (six) hours as needed for wheezing or shortness of breath.  . benztropine (COGENTIN) 0.5 MG tablet Take 0.5 mg by mouth daily.  Marland Kitchen FLUoxetine (PROZAC) 20 MG capsule Take 20 mg by mouth daily.  . haloperidol (HALDOL) 5 MG tablet Take 5 mg by mouth at bedtime.   . hydrOXYzine (ATARAX/VISTARIL) 50 MG tablet Take 50 mg by mouth at bedtime.  . traZODone (DESYREL) 50 MG tablet Take 50 mg by mouth at bedtime.  . Vitamin D, Ergocalciferol, (DRISDOL) 50000 units CAPS capsule Take 1 capsule (50,000 Units total) by mouth every 7 (seven) days.   No facility-administered encounter medications on file as of 07/21/2017.     Allergies  Allergen Reactions  . Tomato     swelling    Review of Systems  Constitutional: Negative for fatigue and fever.  HENT: Negative for congestion and dental problem.   Eyes: Negative for photophobia and visual disturbance.  Respiratory: Negative for cough and shortness of breath.   Cardiovascular: Negative for chest pain and leg swelling.  Gastrointestinal: Negative for abdominal distention, abdominal pain, constipation and diarrhea.  Genitourinary: Negative for dysuria, flank pain and frequency.  Musculoskeletal: Negative  for back pain and gait problem.  Neurological: Negative for dizziness and headaches.  Psychiatric/Behavioral: Negative for decreased concentration. The patient is not nervous/anxious.    BP 100/60   Pulse 75   Ht  (1.626 m)   Wt 237 lb (107.5 kg)   SpO2 90%   BMI 40.68 kg/m   Physical Exam  Constitutional: She appears well-developed and well-nourished. No distress.  Poor eye contact.  Obese  HENT:  Head: Normocephalic and atraumatic.  Mouth/Throat: Oropharynx is clear and moist.  Eyes: Pupils are equal, round, and reactive to light. Conjunctivae are normal.  Cardiovascular: Normal rate, regular rhythm and normal heart sounds.   Pulmonary/Chest: Effort normal and breath sounds normal. She has no wheezes.  Musculoskeletal: Normal range of motion. She exhibits edema.  trace  Neurological: She is alert. Coordination normal.  Psychiatric:  Poor fund of knowledge.  Slow responses.  Requires repetition    ASSESSMENT/PLAN:  1. Need for immunization against influenza  - Flu Vaccine QUAD 36+ mos IM  2. Tobacco abuse  3. Class 3 severe obesity due to excess calories without serious comorbidity with body mass index (BMI) of 40.0 to 44.9 in adult (HCC)  4. Vitamin D  deficiency   Patient Instructions  You have lost weight continue to walk every day Good work!!  Take vitamin D once a week for 12 weeks A prescription was sent to your pharmacy at [facility] After this take 2000 U a day ( this is over the counter)  See me in 3 months  Call sooner  for problems      Eustace Moore, MD

## 2017-07-21 NOTE — Patient Instructions (Signed)
You have lost weight continue to walk every day Good work!!  Take vitamin D once a week for 12 weeks A prescription was sent to your pharmacy at [facility] After this take 2000 U a day ( this is over the counter)  See me in 3 months  Call sooner for problems

## 2017-09-26 DIAGNOSIS — F251 Schizoaffective disorder, depressive type: Secondary | ICD-10-CM | POA: Diagnosis not present

## 2017-10-20 ENCOUNTER — Ambulatory Visit: Payer: Medicare Other | Admitting: Family Medicine

## 2017-10-31 ENCOUNTER — Ambulatory Visit (INDEPENDENT_AMBULATORY_CARE_PROVIDER_SITE_OTHER): Payer: Medicare Other | Admitting: Family Medicine

## 2017-10-31 ENCOUNTER — Encounter: Payer: Self-pay | Admitting: Family Medicine

## 2017-10-31 ENCOUNTER — Other Ambulatory Visit: Payer: Self-pay

## 2017-10-31 VITALS — BP 126/88 | HR 84 | Temp 98.7°F | Resp 18 | Ht 64.0 in | Wt 241.0 lb

## 2017-10-31 DIAGNOSIS — R5383 Other fatigue: Secondary | ICD-10-CM | POA: Diagnosis not present

## 2017-10-31 DIAGNOSIS — E559 Vitamin D deficiency, unspecified: Secondary | ICD-10-CM | POA: Diagnosis not present

## 2017-10-31 DIAGNOSIS — R739 Hyperglycemia, unspecified: Secondary | ICD-10-CM

## 2017-10-31 DIAGNOSIS — Z6841 Body Mass Index (BMI) 40.0 and over, adult: Secondary | ICD-10-CM

## 2017-10-31 NOTE — Progress Notes (Signed)
Chief Complaint  Patient presents with  . Follow-up    3 month   Patient is here for follow-up.  She states she feels well.  She continues under psychiatry care and is compliant with her medications.  She is worried about her weight.  She is requesting "diet pills".  She states that she does not feel able to lose weight on her own.  She is intellectually disabled and has difficulty with dietary instruction.  She does not exercise regularly.  I explained to her that if she would take a walk every day it would be helpful to regulate her weight, and for her health in general. She states she is taking her vitamin D. She states that she is tired a lot of the time.  She naps during the day.  I do not see in her record a TSH.  This could contribute to her weight.  I will get some lab work today.  She does have diabetes in her family.  With her morbid obesity this places her at slightly increased risk.  She has amenorrhea, and possible PCO S. Patient Active Problem List   Diagnosis Date Noted  . Vitamin D deficiency 07/21/2017  . Mild intermittent asthma without complication 06/03/2017  . Tobacco abuse 06/03/2017  . Schizoaffective disorder (HCC) 07/02/2013  . Mild cognitive impairment 07/02/2013  . Amenorrhea 06/12/2013  . Obesity 06/12/2013    Outpatient Encounter Medications as of 10/31/2017  Medication Sig  . albuterol (PROVENTIL HFA;VENTOLIN HFA) 108 (90 BASE) MCG/ACT inhaler Inhale 1-2 puffs into the lungs every 6 (six) hours as needed for wheezing or shortness of breath.  . benztropine (COGENTIN) 0.5 MG tablet Take 0.5 mg by mouth daily.  Marland Kitchen. FLUoxetine (PROZAC) 20 MG capsule Take 20 mg by mouth daily.  . haloperidol (HALDOL) 5 MG tablet Take 5 mg by mouth at bedtime.   . hydrOXYzine (ATARAX/VISTARIL) 50 MG tablet Take 50 mg by mouth at bedtime.  . traZODone (DESYREL) 50 MG tablet Take 50 mg by mouth at bedtime.  . Vitamin D, Ergocalciferol, (DRISDOL) 50000 units CAPS capsule Take 1  capsule (50,000 Units total) by mouth every 7 (seven) days.   No facility-administered encounter medications on file as of 10/31/2017.     Allergies  Allergen Reactions  . Tomato     swelling    Review of Systems  Constitutional: Negative for fatigue and fever.  HENT: Negative for congestion and dental problem.   Eyes: Negative for photophobia and visual disturbance.  Respiratory: Negative for cough and shortness of breath.   Cardiovascular: Negative for chest pain and leg swelling.  Gastrointestinal: Negative for abdominal distention, abdominal pain, constipation and diarrhea.  Genitourinary: Negative for dysuria, flank pain and frequency.  Musculoskeletal: Negative for back pain and gait problem.  Neurological: Negative for dizziness and headaches.  Psychiatric/Behavioral: Negative for decreased concentration. The patient is not nervous/anxious.   Denies any symptoms   BP 126/88 (BP Location: Left Arm, Patient Position: Sitting, Cuff Size: Large)   Pulse 84   Temp 98.7 F (37.1 C) (Temporal)   Resp 18   Ht 5\' 4"  (1.626 m)   Wt 241 lb 0.6 oz (109.3 kg)   SpO2 98%   BMI 41.37 kg/m   Physical Exam  Constitutional: She appears well-developed and well-nourished. No distress.  Poor eye contact.  Obese  HENT:  Head: Normocephalic and atraumatic.  Mouth/Throat: Oropharynx is clear and moist.  Eyes: Conjunctivae are normal. Pupils are equal, round, and reactive  to light.  Neck: Normal range of motion. No thyromegaly present.  Cardiovascular: Normal rate, regular rhythm and normal heart sounds.  Pulmonary/Chest: Effort normal and breath sounds normal. She has no wheezes.  Abdominal: Soft. Bowel sounds are normal.  Large abdominal girth  Musculoskeletal: Normal range of motion. She exhibits edema.  trace  Neurological: She is alert. Coordination normal.  Psychiatric:  Poor fund of knowledge.  Slow responses.  Requires repetition    ASSESSMENT/PLAN:  1. Class 3 severe  obesity due to excess calories without serious comorbidity with body mass index (BMI) of 40.0 to 44.9 in adult [facility]) Intellectual disability limits her comprehension of diet advice. - CBC - COMPLETE METABOLIC PANEL WITH GFR - Lipid panel  2. Vitamin D deficiency Replaced - VITAMIN D 25 Hydroxy (Vit-D Deficiency, Fractures)  3. Fatigue, unspecified type Likely due to her psychiatric medications - TSH  4. Elevated blood sugar Family history of diabetes - Hemoglobin A1c We discussed that diet pills are really safe.  With a family history of diabetes and a large abdominal girth, I think that a trial of low-dose metformin would be reasonable.  Patient Instructions  Take the metformin once a day try to reduce the sweets in your diet Reduce the amount of bread and pasta Walk every day that you are able Come back in a month for a follow up and weight check Go for blood tests today    Eustace Moore, MD

## 2017-10-31 NOTE — Patient Instructions (Addendum)
Take the metformin once a day try to reduce the sweets in your diet Reduce the amount of bread and pasta Walk every day that you are able Come back in a month for a follow up and weight check Go for blood tests today

## 2017-11-01 ENCOUNTER — Telehealth: Payer: Self-pay | Admitting: Family Medicine

## 2017-11-01 LAB — HEMOGLOBIN A1C
Hgb A1c MFr Bld: 5.1 % of total Hgb (ref <5.7)
Mean Plasma Glucose: 100 (calc)
eAG (mmol/L): 5.5 (calc)

## 2017-11-01 LAB — CBC
HEMATOCRIT: 41.9 % (ref 35.0–45.0)
HEMOGLOBIN: 14.1 g/dL (ref 11.7–15.5)
MCH: 29 pg (ref 27.0–33.0)
MCHC: 33.7 g/dL (ref 32.0–36.0)
MCV: 86.2 fL (ref 80.0–100.0)
MPV: 11.3 fL (ref 7.5–12.5)
Platelets: 257 10*3/uL (ref 140–400)
RBC: 4.86 10*6/uL (ref 3.80–5.10)
RDW: 13.5 % (ref 11.0–15.0)
WBC: 10.1 10*3/uL (ref 3.8–10.8)

## 2017-11-01 LAB — TSH: TSH: 0.59 mIU/L

## 2017-11-01 LAB — COMPLETE METABOLIC PANEL WITH GFR
AG RATIO: 1.1 (calc) (ref 1.0–2.5)
ALT: 9 U/L (ref 6–29)
AST: 11 U/L (ref 10–30)
Albumin: 3.6 g/dL (ref 3.6–5.1)
Alkaline phosphatase (APISO): 72 U/L (ref 33–115)
BUN/Creatinine Ratio: 4 (calc) — ABNORMAL LOW (ref 6–22)
BUN: 4 mg/dL — ABNORMAL LOW (ref 7–25)
CO2: 28 mmol/L (ref 20–32)
CREATININE: 0.89 mg/dL (ref 0.50–1.10)
Calcium: 9.4 mg/dL (ref 8.6–10.2)
Chloride: 103 mmol/L (ref 98–110)
GFR, Est African American: 93 mL/min/{1.73_m2} (ref >=60)
GFR, Est Non African American: 80 mL/min/{1.73_m2} (ref >=60)
GLOBULIN: 3.2 g/dL (ref 1.9–3.7)
Glucose, Bld: 87 mg/dL (ref 65–139)
Potassium: 3.8 mmol/L (ref 3.5–5.3)
SODIUM: 137 mmol/L (ref 135–146)
TOTAL PROTEIN: 6.8 g/dL (ref 6.1–8.1)
Total Bilirubin: 0.4 mg/dL (ref 0.2–1.2)

## 2017-11-01 LAB — LIPID PANEL
Cholesterol: 178 mg/dL (ref <200)
HDL: 41 mg/dL — ABNORMAL LOW (ref >50)
LDL Cholesterol (Calc): 112 mg/dL (calc) — ABNORMAL HIGH
Non-HDL Cholesterol (Calc): 137 mg/dL (calc) — ABNORMAL HIGH (ref <130)
Total CHOL/HDL Ratio: 4.3 (calc) (ref <5.0)
Triglycerides: 141 mg/dL (ref <150)

## 2017-11-01 LAB — VITAMIN D 25 HYDROXY (VIT D DEFICIENCY, FRACTURES): Vit D, 25-Hydroxy: 22 ng/mL — ABNORMAL LOW (ref 30–100)

## 2017-11-01 MED ORDER — METFORMIN HCL ER 500 MG PO TB24: 500.0000 mg | ORAL_TABLET | Freq: Every day | ORAL | 0 refills | Status: DC

## 2017-11-01 NOTE — Telephone Encounter (Signed)
What dose of metformin?

## 2017-11-01 NOTE — Telephone Encounter (Signed)
Done Synetta FailAnita aware.

## 2017-11-01 NOTE — Telephone Encounter (Signed)
Metformin Exar 500 mg with breakfast

## 2017-11-01 NOTE — Telephone Encounter (Signed)
Patient needs refill of metformin.

## 2017-11-02 ENCOUNTER — Encounter: Payer: Self-pay | Admitting: Family Medicine

## 2017-12-01 ENCOUNTER — Ambulatory Visit: Payer: Medicare Other | Admitting: Family Medicine

## 2017-12-29 ENCOUNTER — Encounter: Payer: Self-pay | Admitting: Family Medicine

## 2017-12-29 ENCOUNTER — Ambulatory Visit (INDEPENDENT_AMBULATORY_CARE_PROVIDER_SITE_OTHER): Payer: Medicare Other | Admitting: Family Medicine

## 2017-12-29 VITALS — BP 124/72 | HR 87 | Temp 98.3°F | Ht 64.0 in | Wt 237.5 lb

## 2017-12-29 DIAGNOSIS — R112 Nausea with vomiting, unspecified: Secondary | ICD-10-CM | POA: Diagnosis not present

## 2017-12-29 DIAGNOSIS — R350 Frequency of micturition: Secondary | ICD-10-CM

## 2017-12-29 LAB — POCT URINALYSIS DIPSTICK
Glucose, UA: NEGATIVE
KETONES UA: NEGATIVE
Nitrite, UA: NEGATIVE
PH UA: 6 (ref 5.0–8.0)
Protein, UA: 30
Spec Grav, UA: >=1.03 — AB (ref 1.010–1.025)
UROBILINOGEN UA: 0.2 U/dL

## 2017-12-29 LAB — POCT URINE PREGNANCY: Preg Test, Ur: NEGATIVE

## 2017-12-29 MED ORDER — ONDANSETRON HCL 8 MG PO TABS: 8.0000 mg | ORAL_TABLET | Freq: Three times a day (TID) | ORAL | 0 refills | Status: DC | PRN

## 2017-12-29 MED ORDER — CIPROFLOXACIN HCL 500 MG PO TABS: 500.0000 mg | ORAL_TABLET | Freq: Two times a day (BID) | ORAL | 0 refills | Status: DC

## 2017-12-29 NOTE — Patient Instructions (Addendum)
Take the ondansetron as needed for vomiting 20-30 minutes later can try sips of water  Stick with bland diet Take the antibiotic as directed Call if not better by Monday
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
## 2017-12-30 LAB — URINE CULTURE
MICRO NUMBER: 90295247
SPECIMEN QUALITY:: ADEQUATE

## 2018-01-02 ENCOUNTER — Encounter: Payer: Self-pay | Admitting: Family Medicine

## 2018-01-25 DIAGNOSIS — F251 Schizoaffective disorder, depressive type: Secondary | ICD-10-CM | POA: Diagnosis not present

## 2018-05-17 DIAGNOSIS — F251 Schizoaffective disorder, depressive type: Secondary | ICD-10-CM | POA: Diagnosis not present

## 2018-09-14 DIAGNOSIS — F251 Schizoaffective disorder, depressive type: Secondary | ICD-10-CM | POA: Diagnosis not present

## 2018-11-01 ENCOUNTER — Ambulatory Visit: Payer: Medicare Other | Admitting: Family Medicine

## 2018-12-28 DIAGNOSIS — F251 Schizoaffective disorder, depressive type: Secondary | ICD-10-CM | POA: Diagnosis not present

## 2019-05-30 DIAGNOSIS — F251 Schizoaffective disorder, depressive type: Secondary | ICD-10-CM | POA: Diagnosis not present

## 2019-08-20 ENCOUNTER — Encounter (HOSPITAL_COMMUNITY): Payer: Self-pay | Admitting: Emergency Medicine

## 2019-08-20 ENCOUNTER — Other Ambulatory Visit: Payer: Self-pay

## 2019-08-20 DIAGNOSIS — Z7984 Long term (current) use of oral hypoglycemic drugs: Secondary | ICD-10-CM | POA: Insufficient documentation

## 2019-08-20 DIAGNOSIS — Z79899 Other long term (current) drug therapy: Secondary | ICD-10-CM | POA: Diagnosis not present

## 2019-08-20 DIAGNOSIS — R1110 Vomiting, unspecified: Secondary | ICD-10-CM | POA: Diagnosis present

## 2019-08-20 DIAGNOSIS — R110 Nausea: Secondary | ICD-10-CM | POA: Diagnosis not present

## 2019-08-20 DIAGNOSIS — I10 Essential (primary) hypertension: Secondary | ICD-10-CM | POA: Diagnosis not present

## 2019-08-20 DIAGNOSIS — H81399 Other peripheral vertigo, unspecified ear: Secondary | ICD-10-CM | POA: Insufficient documentation

## 2019-08-20 DIAGNOSIS — F17210 Nicotine dependence, cigarettes, uncomplicated: Secondary | ICD-10-CM | POA: Insufficient documentation

## 2019-08-20 DIAGNOSIS — R112 Nausea with vomiting, unspecified: Secondary | ICD-10-CM | POA: Diagnosis not present

## 2019-08-20 LAB — CBC
HCT: 49.2 % — ABNORMAL HIGH (ref 36.0–46.0)
Hemoglobin: 15.9 g/dL — ABNORMAL HIGH (ref 12.0–15.0)
MCH: 30.1 pg (ref 26.0–34.0)
MCHC: 32.3 g/dL (ref 30.0–36.0)
MCV: 93 fL (ref 80.0–100.0)
Platelets: 229 10*3/uL (ref 150–400)
RBC: 5.29 MIL/uL — ABNORMAL HIGH (ref 3.87–5.11)
RDW: 14.4 % (ref 11.5–15.5)
WBC: 12.3 10*3/uL — ABNORMAL HIGH (ref 4.0–10.5)
nRBC: 0 % (ref 0.0–0.2)

## 2019-08-20 LAB — COMPREHENSIVE METABOLIC PANEL
ALT: 11 U/L (ref 0–44)
AST: 15 U/L (ref 15–41)
Albumin: 4 g/dL (ref 3.5–5.0)
Alkaline Phosphatase: 64 U/L (ref 38–126)
Anion gap: 10 (ref 5–15)
BUN: 8 mg/dL (ref 6–20)
CO2: 26 mmol/L (ref 22–32)
Calcium: 9.5 mg/dL (ref 8.9–10.3)
Chloride: 101 mmol/L (ref 98–111)
Creatinine, Ser: 1.08 mg/dL — ABNORMAL HIGH (ref 0.44–1.00)
GFR calc Af Amer: >60 mL/min (ref >60)
GFR calc non Af Amer: >60 mL/min (ref >60)
Glucose, Bld: 116 mg/dL — ABNORMAL HIGH (ref 70–99)
Potassium: 4 mmol/L (ref 3.5–5.1)
Sodium: 137 mmol/L (ref 135–145)
Total Bilirubin: 0.6 mg/dL (ref 0.3–1.2)
Total Protein: 7.9 g/dL (ref 6.5–8.1)

## 2019-08-20 LAB — LIPASE, BLOOD: Lipase: 17 U/L (ref 11–51)

## 2019-08-20 MED ORDER — SODIUM CHLORIDE 0.9% FLUSH: 3.0000 mL | Freq: Once | INTRAVENOUS | Status: DC

## 2019-08-20 NOTE — ED Triage Notes (Signed)
Pt c/o n/v since this am and now states she feels dizzy at times.

## 2019-08-21 ENCOUNTER — Emergency Department (HOSPITAL_COMMUNITY)
Admission: EM | Admit: 2019-08-21 | Discharge: 2019-08-21 | Disposition: A | Discharge status: Home / Self Care | Payer: Medicare Other | Attending: Emergency Medicine | Admitting: Emergency Medicine

## 2019-08-21 DIAGNOSIS — R112 Nausea with vomiting, unspecified: Secondary | ICD-10-CM

## 2019-08-21 DIAGNOSIS — H81399 Other peripheral vertigo, unspecified ear: Secondary | ICD-10-CM | POA: Diagnosis not present

## 2019-08-21 LAB — URINALYSIS, ROUTINE W REFLEX MICROSCOPIC
Bilirubin Urine: NEGATIVE
Glucose, UA: NEGATIVE mg/dL
Ketones, ur: NEGATIVE mg/dL
Nitrite: POSITIVE — AB
Protein, ur: 30 mg/dL — AB
Specific Gravity, Urine: 1.019 (ref 1.005–1.030)
WBC, UA: >50 WBC/hpf — ABNORMAL HIGH (ref 0–5)
pH: 6 (ref 5.0–8.0)

## 2019-08-21 LAB — POC URINE PREG, ED: Preg Test, Ur: NEGATIVE

## 2019-08-21 MED ORDER — MECLIZINE HCL 12.5 MG PO TABS
25.0000 mg | ORAL_TABLET | Freq: Once | ORAL | Status: AC
  Administered 2019-08-21: 04:00:00 25 mg via ORAL
  Filled 2019-08-21: qty 2

## 2019-08-21 MED ORDER — ONDANSETRON HCL 4 MG PO TABS: 4.0000 mg | ORAL_TABLET | Freq: Four times a day (QID) | ORAL | 0 refills | Status: DC | PRN

## 2019-08-21 MED ORDER — SODIUM CHLORIDE 0.9 % IV BOLUS
1000.0000 mL | Freq: Once | INTRAVENOUS | Status: AC
  Administered 2019-08-21: 1000 mL via INTRAVENOUS

## 2019-08-21 MED ORDER — ONDANSETRON HCL 4 MG/2ML IJ SOLN
4.0000 mg | Freq: Once | INTRAMUSCULAR | Status: AC
  Administered 2019-08-21: 04:00:00 4 mg via INTRAVENOUS
  Filled 2019-08-21: qty 2

## 2019-08-21 MED ORDER — MECLIZINE HCL 25 MG PO TABS: 25.0000 mg | ORAL_TABLET | Freq: Three times a day (TID) | ORAL | 0 refills | Status: DC | PRN

## 2019-08-21 NOTE — ED Provider Notes (Signed)
[facility] EMERGENCY DEPARTMENT Provider Note   CSN: [id number] Arrival date & time: 08/20/19  2015    History   Chief Complaint Chief Complaint  Patient presents with  . Emesis    HPI [name] is a 43 y.o. female.   The history is provided by the patient.  Emesis She has history of hypertension, hyperlipidemia and had onset this morning of vertigo with nausea and vomiting.  She has vomited approximately 5 times during the course of day.  She has not been able to hold anything down.  She denies fever or chills.  She denies ear pain or hearing loss or tinnitus.  She denies abdominal pain, diarrhea.  Nothing makes her symptoms better, nothing makes them worse.  She has not done anything to treat her symptoms.  Of note, she states she had premature menopause approximately 10 years ago.  Past Medical History:  Diagnosis Date  . Allergy   . Anxiety   . Arthritis   . Asthma   . Bipolar 1 disorder (HCC)   . Depression   . Ectopic pregnancy   . Hepatitis B infection   . Hyperlipidemia   . Hypertension   . Schizophrenia [facility])     Patient Active Problem List   Diagnosis Date Noted  . Vitamin D deficiency 07/21/2017  . Mild intermittent asthma without complication 06/03/2017  . Tobacco abuse 06/03/2017  . Schizoaffective disorder (HCC) 07/02/2013  . Mild cognitive impairment 07/02/2013  . Amenorrhea 06/12/2013  . Obesity 06/12/2013    Past Surgical History:  Procedure Laterality Date  . DIAGNOSTIC LAPAROSCOPY WITH REMOVAL OF ECTOPIC PREGNANCY       OB History    Gravida  1   Para      Term      Preterm      AB  1   Living        SAB      TAB      Ectopic  1   Multiple      Live Births               Home Medications    Prior to Admission medications   Medication Sig Start Date End Date Taking? Authorizing Provider  albuterol (PROVENTIL HFA;VENTOLIN HFA) 108 (90 BASE) MCG/ACT inhaler Inhale 1-2 puffs into the lungs every 6 (six)  hours as needed for wheezing or shortness of breath. 03/13/15   Zadie Rhine, MD  benztropine (COGENTIN) 0.5 MG tablet Take 0.5 mg by mouth daily.    [provider]  ciprofloxacin (CIPRO) 500 MG tablet Take 1 tablet (500 mg total) by mouth 2 (two) times daily. 12/29/17   Eustace Moore, MD  FLUoxetine (PROZAC) 20 MG capsule Take 20 mg by mouth daily. 02/21/15   [provider]  haloperidol (HALDOL) 10 MG tablet  12/27/17   [provider]  hydrOXYzine (ATARAX/VISTARIL) 50 MG tablet Take 50 mg by mouth at bedtime.    [provider]  metFORMIN (GLUCOPHAGE-XR) 500 MG 24 hr tablet Take 1 tablet (500 mg total) by mouth daily with breakfast. 11/01/17   Eustace Moore, MD  ondansetron (ZOFRAN) 8 MG tablet Take 1 tablet (8 mg total) by mouth every 8 (eight) hours as needed for nausea or vomiting. 12/29/17   Eustace Moore, MD  traZODone (DESYREL) 50 MG tablet Take 50 mg by mouth at bedtime. 02/21/15   [provider]  Vitamin D, Ergocalciferol, (DRISDOL) 50000 units CAPS capsule Take 1  capsule (50,000 Units total) by mouth every 7 (seven) days. 06/07/17   Eustace MooreNelson, Yvonne Sue, MD    Family History Family History  Problem Relation Age of Onset  . Heart disease Mother   . Stroke Mother   . Asthma Mother   . Arthritis Mother   . Depression Mother   . Diabetes Mother   . Hyperlipidemia Mother   . Hypertension Mother   . Hypertension Father   . Hyperlipidemia Father     Social History Social History   Tobacco Use  . Smoking status: Current Every Day Smoker    Packs/day: 2.00    Types: Cigarettes    Start date: 10/25/2005  . Smokeless tobacco: Never Used  Substance Use Topics  . Alcohol use: No    Alcohol/week: 0.0 standard drinks  . Drug use: No    Comment: 4 years ago     Allergies   Tomato   Review of Systems Review of Systems  Gastrointestinal: Positive for vomiting.  All other systems reviewed and are negative.    Physical  Exam Updated Vital Signs BP 111/79   Pulse 73   Temp 98.1 F (36.7 C) (Oral)   Resp 20   Ht 5\' 4"  (1.626 m)   Wt 113.4 kg   SpO2 96%   BMI 42.91 kg/m   Physical Exam Vitals signs and nursing note reviewed.    43 year old female, resting comfortably and in no acute distress. Vital signs are normal. Oxygen saturation is 96%, which is normal. Head is normocephalic and atraumatic. PERRLA, EOMI. Oropharynx is clear. Neck is nontender and supple without adenopathy or JVD. Back is nontender and there is no CVA tenderness. Lungs are clear without rales, wheezes, or rhonchi. Chest is nontender. Heart has regular rate and rhythm without murmur. Abdomen is soft, flat, nontender without masses or hepatosplenomegaly and peristalsis is hypoactive. Extremities have no cyanosis or edema, full range of motion is present. Skin is warm and dry without rash. Neurologic: Mental status is normal, cranial nerves are intact, there are no motor or sensory deficits.  Dizziness is reproduced by passive head movement.  ED Treatments / Results  Labs (all labs ordered are listed, but only abnormal results are displayed) Labs Reviewed  COMPREHENSIVE METABOLIC PANEL - Abnormal; Notable for the following components:      Result Value   Glucose, Bld 116 (*)    Creatinine, Ser 1.08 (*)    All other components within normal limits  CBC - Abnormal; Notable for the following components:   WBC 12.3 (*)    RBC 5.29 (*)    Hemoglobin 15.9 (*)    HCT 49.2 (*)    All other components within normal limits  URINALYSIS, ROUTINE W REFLEX MICROSCOPIC - Abnormal; Notable for the following components:   APPearance CLOUDY (*)    Hgb urine dipstick SMALL (*)    Protein, ur 30 (*)    Nitrite POSITIVE (*)    Leukocytes,Ua MODERATE (*)    WBC, UA >50 (*)    Bacteria, UA FEW (*)    All other components within normal limits  LIPASE, BLOOD  POC URINE PREG, ED   Procedures Procedures   Medications Ordered in ED  Medications  sodium chloride flush (NS) 0.9 % injection 3 mL (has no administration in time range)  ondansetron (ZOFRAN) injection 4 mg (has no administration in time range)  sodium chloride 0.9 % bolus 1,000 mL (has no administration in time range)  meclizine (ANTIVERT) tablet  25 mg (has no administration in time range)     Initial Impression / Assessment and Plan / ED Course  I have reviewed the triage vital signs and the nursing notes.  Pertinent lab results that were available during my care of the patient were reviewed by me and considered in my medical decision making (see chart for details).  Peripheral vertigo with vomiting.  She does not appear clinically dehydrated, but labs do show elevated hemoglobin over baseline suggesting possible dehydration.  Electrolytes, BUN, creatinine are all normal.  She will be given IV fluids, ondansetron, and oral meclizine.  Old records are reviewed and she does have prior office visit for nausea and vomiting.  Also, she had a cervical ectopic pregnancy in 2012, so clearly she is not 10 years postmenopausal.  Pregnancy test will need to be checked.  Pregnancy test is negative.  She feels much better after above-noted treatment.  She is discharged with prescriptions for meclizine and ondansetron.  Final Clinical Impressions(s) / ED Diagnoses   Final diagnoses:  Peripheral vertigo, unspecified laterality  Non-intractable vomiting with nausea, unspecified vomiting type    ED Discharge Orders         Ordered    meclizine (ANTIVERT) 25 MG tablet  3 times daily PRN     08/21/19 0544    ondansetron (ZOFRAN) 4 MG tablet  Every 6 hours PRN     08/21/19 7939           Delora Fuel, MD 03/00/92 [phone]

## 2019-09-19 DIAGNOSIS — F251 Schizoaffective disorder, depressive type: Secondary | ICD-10-CM | POA: Diagnosis not present

## 2020-01-09 DIAGNOSIS — F251 Schizoaffective disorder, depressive type: Secondary | ICD-10-CM | POA: Diagnosis not present

## 2020-01-29 DIAGNOSIS — Z23 Encounter for immunization: Secondary | ICD-10-CM | POA: Diagnosis not present

## 2020-02-25 ENCOUNTER — Ambulatory Visit (INDEPENDENT_AMBULATORY_CARE_PROVIDER_SITE_OTHER): Payer: Medicare Other | Admitting: Family Medicine

## 2020-02-25 ENCOUNTER — Encounter: Payer: Self-pay | Admitting: Family Medicine

## 2020-02-25 ENCOUNTER — Other Ambulatory Visit: Payer: Self-pay

## 2020-02-25 VITALS — BP 114/79 | HR 81 | Temp 97.9°F | Ht 64.0 in | Wt 254.0 lb

## 2020-02-25 DIAGNOSIS — Z6841 Body Mass Index (BMI) 40.0 and over, adult: Secondary | ICD-10-CM

## 2020-02-25 DIAGNOSIS — E559 Vitamin D deficiency, unspecified: Secondary | ICD-10-CM | POA: Diagnosis not present

## 2020-02-25 DIAGNOSIS — Z01419 Encounter for gynecological examination (general) (routine) without abnormal findings: Secondary | ICD-10-CM | POA: Diagnosis not present

## 2020-02-25 DIAGNOSIS — Z1231 Encounter for screening mammogram for malignant neoplasm of breast: Secondary | ICD-10-CM

## 2020-02-26 LAB — COMPLETE METABOLIC PANEL WITH GFR
AG Ratio: 1.2 (calc) (ref 1.0–2.5)
ALT: 11 U/L (ref 6–29)
AST: 14 U/L (ref 10–35)
Albumin: 3.7 g/dL (ref 3.6–5.1)
Alkaline phosphatase (APISO): 61 U/L (ref 31–125)
BUN/Creatinine Ratio: 5 (calc) — ABNORMAL LOW (ref 6–22)
BUN: 6 mg/dL — ABNORMAL LOW (ref 7–25)
CO2: 29 mmol/L (ref 20–32)
Calcium: 9.2 mg/dL (ref 8.6–10.2)
Chloride: 103 mmol/L (ref 98–110)
Creat: 1.1 mg/dL (ref 0.50–1.10)
GFR, Est African American: 70 mL/min/{1.73_m2} (ref >=60)
GFR, Est Non African American: 61 mL/min/{1.73_m2} (ref >=60)
Globulin: 3 g/dL (calc) (ref 1.9–3.7)
Glucose, Bld: 99 mg/dL (ref 65–139)
Potassium: 3.7 mmol/L (ref 3.5–5.3)
Sodium: 137 mmol/L (ref 135–146)
Total Bilirubin: 0.5 mg/dL (ref 0.2–1.2)
Total Protein: 6.7 g/dL (ref 6.1–8.1)

## 2020-02-26 LAB — VITAMIN D 25 HYDROXY (VIT D DEFICIENCY, FRACTURES): Vit D, 25-Hydroxy: 23 ng/mL — ABNORMAL LOW (ref 30–100)

## 2020-02-27 ENCOUNTER — Ambulatory Visit: Payer: Medicare Other | Admitting: Family Medicine

## 2020-02-28 NOTE — Progress Notes (Signed)
Established Patient Office Visit  Subjective:  Patient ID: [name], female    DOB: 03/01/1976  Age: 44 y.o. MRN: [id number]  CC:  Vit d deficiency Obesity-took glucophage in the past Mental health provider-sees pt for schizoaffective d/o -haldol/vistaril/tarzodone Asthma-uses prn albuterol  HPI [name] presents for concern about Vit D level-takes weekly-no recent levels  Asthma-no recent exacerbations Past Medical History:  Diagnosis Date  . Allergy   . Anxiety   . Arthritis   . Asthma   . Bipolar 1 disorder (HCC)   . Depression   . Ectopic pregnancy   . Hepatitis B infection   . Hyperlipidemia   . Hypertension   . Schizophrenia [facility])     Past Surgical History:  Procedure Laterality Date  . DIAGNOSTIC LAPAROSCOPY WITH REMOVAL OF ECTOPIC PREGNANCY      Family History  Problem Relation Age of Onset  . Heart disease Mother   . Stroke Mother   . Asthma Mother   . Arthritis Mother   . Depression Mother   . Diabetes Mother   . Hyperlipidemia Mother   . Hypertension Mother   . Hypertension Father   . Hyperlipidemia Father     Social History   Socioeconomic History  . Marital status: Single    Spouse name: Not on file  . Number of children: 0  . Years of education: HS  . Highest education level: Not on file  Occupational History  . Occupation: DISABLED    Employer: UNEMPLOYED  Tobacco Use  . Smoking status: Current Every Day Smoker    Packs/day: 2.00    Types: Cigarettes    Start date: 10/25/2005  . Smokeless tobacco: Never Used  Substance and Sexual Activity  . Alcohol use: No    Alcohol/week: 0.0 standard drinks  . Drug use: No    Comment: 4 years ago  . Sexual activity: Yes    Birth control/protection: None  Other Topics Concern  . Not on file  Social History Narrative   Patient lives at home with her mother and father   Liliane Shi to watch TV   Does not drive   Social Determinants of Health   Financial Resource Strain:   .  Difficulty of Paying Living Expenses:   Food Insecurity:   . Worried About Programme researcher, broadcasting/film/video in the Last Year:   . Barista in the Last Year:   Transportation Needs:   . Freight forwarder (Medical):   Marland Kitchen Lack of Transportation (Non-Medical):   Physical Activity:   . Days of Exercise per Week:   . Minutes of Exercise per Session:   Stress:   . Feeling of Stress :   Social Connections:   . Frequency of Communication with Friends and Family:   . Frequency of Social Gatherings with Friends and Family:   . Attends Religious Services:   . Active Member of Clubs or Organizations:   . Attends Banker Meetings:   Marland Kitchen Marital Status:   Intimate Partner Violence:   . Fear of Current or Ex-Partner:   . Emotionally Abused:   Marland Kitchen Physically Abused:   . Sexually Abused:     Outpatient Medications Prior to Visit  Medication Sig Dispense Refill  . albuterol (PROVENTIL HFA;VENTOLIN HFA) 108 (90 BASE) MCG/ACT inhaler Inhale 1-2 puffs into the lungs every 6 (six) hours as needed for wheezing or shortness of breath. 1 Inhaler 0  . benztropine (COGENTIN) 1 MG tablet Take 1  mg by mouth 2 (two) times daily.    . cholecalciferol (VITAMIN D3) 25 MCG (1000 UNIT) tablet Take 1,000 Units by mouth daily.    Marland Kitchen FLUoxetine (PROZAC) 20 MG capsule Take 20 mg by mouth daily.  0  . folic acid (FOLVITE) 1 MG tablet Take 1 mg by mouth daily. As needed    . haloperidol (HALDOL) 5 MG tablet Take 5 mg by mouth 2 (two) times daily.    . hydrOXYzine (ATARAX/VISTARIL) 50 MG tablet Take one (1) tablet by mouth three times a day    . traZODone (DESYREL) 100 MG tablet Take 200 mg by mouth at bedtime as needed.    . benztropine (COGENTIN) 0.5 MG tablet Take 0.5 mg by mouth daily.    . haloperidol (HALDOL) 10 MG tablet     . hydrOXYzine (ATARAX/VISTARIL) 50 MG tablet Take 50 mg by mouth at bedtime.    . meclizine (ANTIVERT) 25 MG tablet Take 1 tablet (25 mg total) by mouth 3 (three) times daily as needed  for dizziness. 30 tablet 0  . metFORMIN (GLUCOPHAGE-XR) 500 MG 24 hr tablet Take 1 tablet (500 mg total) by mouth daily with breakfast. 90 tablet 0  . ondansetron (ZOFRAN) 4 MG tablet Take 1 tablet (4 mg total) by mouth every 6 (six) hours as needed. 12 tablet 0  . traZODone (DESYREL) 50 MG tablet Take 50 mg by mouth at bedtime.  0  . Vitamin D, Ergocalciferol, (DRISDOL) 50000 units CAPS capsule Take 1 capsule (50,000 Units total) by mouth every 7 (seven) days. 12 capsule 0   No facility-administered medications prior to visit.    Allergies  Allergen Reactions  . Acetaminophen Other (See Comments)  . Banana   . Tomato     swelling    ROS Review of Systems  Respiratory: Negative.   Cardiovascular: Negative.   Gastrointestinal: Negative.   Skin:       Itching -worse at night uses-vistaril  Allergic/Immunologic: Positive for environmental allergies.  Psychiatric/Behavioral: Negative for agitation.       Sees mental health      Objective:    Physical Exam  Constitutional: She is oriented to person, place, and time. She appears well-developed and well-nourished.  HENT:  Head: Normocephalic and atraumatic.  Cardiovascular: Normal rate and regular rhythm.  Pulmonary/Chest: Effort normal and breath sounds normal.  Musculoskeletal:        General: Edema present.     Comments: Feet bilat edema  Neurological: She is oriented to person, place, and time.  Psychiatric: She has a normal mood and affect. Her behavior is normal.    BP 114/79 (BP Location: Right Arm, Patient Position: Sitting)   Pulse 81   Temp 97.9 F (36.6 C) (Temporal)   Ht 5\' 4"  (1.626 m)   Wt 254 lb (115.2 kg)   SpO2 94%   BMI 43.60 kg/m  Wt Readings from Last 3 Encounters:  02/25/20 254 lb (115.2 kg)  08/20/19 250 lb (113.4 kg)  12/29/17 237 lb 8 oz (107.7 kg)     Health Maintenance Due  Topic Date Due  . COVID-19 Vaccine (1) Never done  . TETANUS/TDAP  Never done  . PAP SMEAR-Modifier  06/12/2016     Lab Results  Component Value Date   TSH 0.59 10/31/2017   Lab Results  Component Value Date   WBC 12.3 (H) 08/20/2019   HGB 15.9 (H) 08/20/2019   HCT 49.2 (H) 08/20/2019   MCV 93.0 08/20/2019   PLT  229 08/20/2019   Lab Results  Component Value Date   NA 137 02/25/2020   K 3.7 02/25/2020   CO2 29 02/25/2020   GLUCOSE 99 02/25/2020   BUN 6 (L) 02/25/2020   CREATININE 1.10 02/25/2020   BILITOT 0.5 02/25/2020   ALKPHOS 64 08/20/2019   AST 14 02/25/2020   ALT 11 02/25/2020   PROT 6.7 02/25/2020   ALBUMIN 4.0 08/20/2019   CALCIUM 9.2 02/25/2020   ANIONGAP 10 08/20/2019   Lab Results  Component Value Date   CHOL 178 10/31/2017   Lab Results  Component Value Date   HDL 41 (L) 10/31/2017   Lab Results  Component Value Date   LDLCALC 112 (H) 10/31/2017   Lab Results  Component Value Date   TRIG 141 10/31/2017   Lab Results  Component Value Date   CHOLHDL 4.3 10/31/2017   Lab Results  Component Value Date   HGBA1C 5.1 10/31/2017      Assessment & Plan:   Problem List Items Addressed This Visit      Other   Obesity - Primary   Relevant Orders   COMPLETE METABOLIC PANEL WITH GFR (Completed)   Vitamin D deficiency   Relevant Orders   Vitamin D (25 hydroxy) (Completed)      No orders of the defined types were placed in this encounter.   Follow-up: No follow-ups on file.    Elric Tirado Hannah Beat, MD

## 2020-02-28 NOTE — Patient Instructions (Signed)
Blood work Mammogram GYN exam

## 2020-02-29 DIAGNOSIS — Z23 Encounter for immunization: Secondary | ICD-10-CM | POA: Diagnosis not present

## 2020-03-14 ENCOUNTER — Ambulatory Visit (HOSPITAL_COMMUNITY): Payer: Medicare Other

## 2020-04-04 ENCOUNTER — Telehealth: Payer: Self-pay | Admitting: Obstetrics and Gynecology

## 2020-04-04 NOTE — Telephone Encounter (Signed)
Called patient regarding appointment and the following message was left:   Updated visitor policy: We are now allowing one support person with you during your upcoming visit.  However, we do ask that they wear a mask and will also be screened at check-in.   We ask if you are sick, have any symptoms of COVID, have had any exposure to anyone suspected or confirmed of having COVID-19, or are awaiting test results for COVID-19, to call our office as we may need to reschedule you for a virtual visit or schedule your appointment for a later date.    Please know we will ask you these questions or similar questions when you arrive for your appointment and understand this is how we are keeping everyone safe.    Also,to keep you safe, please use the provided hand sanitizer when you enter the office. We are asking everyone in the office to wear a mask to help prevent the spread of germs. If you have a mask of your own, please wear it to your appointment, if not, we are happy to provide one for you.  Thank you for understanding and your cooperation.    [facility] Staff      

## 2020-04-07 ENCOUNTER — Ambulatory Visit (INDEPENDENT_AMBULATORY_CARE_PROVIDER_SITE_OTHER): Payer: Medicare Other | Admitting: Obstetrics and Gynecology

## 2020-04-07 ENCOUNTER — Encounter: Payer: Self-pay | Admitting: Obstetrics and Gynecology

## 2020-04-07 ENCOUNTER — Other Ambulatory Visit (HOSPITAL_COMMUNITY)
Admission: RE | Admit: 2020-04-07 | Discharge: 2020-04-07 | Disposition: A | Discharge status: Home / Self Care | Payer: Medicare Other | Source: Ambulatory Visit | Attending: Obstetrics and Gynecology | Admitting: Obstetrics and Gynecology

## 2020-04-07 VITALS — BP 146/101 | HR 81 | Ht 64.0 in | Wt 251.8 lb

## 2020-04-07 DIAGNOSIS — Z01419 Encounter for gynecological examination (general) (routine) without abnormal findings: Secondary | ICD-10-CM

## 2020-04-07 DIAGNOSIS — N912 Amenorrhea, unspecified: Secondary | ICD-10-CM

## 2020-04-07 DIAGNOSIS — Z780 Asymptomatic menopausal state: Secondary | ICD-10-CM | POA: Insufficient documentation

## 2020-04-07 DIAGNOSIS — Z0000 Encounter for general adult medical examination without abnormal findings: Secondary | ICD-10-CM

## 2020-04-07 DIAGNOSIS — Z1151 Encounter for screening for human papillomavirus (HPV): Secondary | ICD-10-CM | POA: Diagnosis not present

## 2020-04-07 NOTE — Progress Notes (Signed)
Patient ID: [name], female   DOB: 07/26/1976, 44 y.o.   MRN: [id number]   Assessment:  Annual Gyn Exam Amenorrhea since age 83 Desires to be pregnant today requests hcg  Plan:  1. Pap smear done, next pap due 3 years 2. Return annually or prn 3    Annual mammogram advised after age 67 4.   Will order blood pregnancy test, fsh estradiol Subjective:  [name] is a 44 y.o. female G1P0010 who presents for annual exam. No LMP recorded. Patient is postmenopausal. The patient would like a pregnancy test today because she is hoping to be pregnant. She reports that her and her partner have both been "having symptoms." She reports that her nipples have been sensitive and itchy recently. Her last PAP smear and her last period were both 13 years ago. She has been with her current partner for 15 years. She performs regular breast self-exams and has no concerns. She is scheduled for a mammogram later this month. She is currently on disability leave from work and is taking care of her mother.  The following portions of the patient's history were reviewed and updated as appropriate: allergies, current medications, past family history, past medical history, past social history, past surgical history and problem list. Past Medical History:  Diagnosis Date  . Allergy   . Anxiety   . Arthritis   . Asthma   . Bipolar 1 disorder ([place])   . Depression   . Ectopic pregnancy   . Hepatitis B infection   . Hyperlipidemia   . Hypertension   . Schizophrenia [facility])     Past Surgical History:  Procedure Laterality Date  . DIAGNOSTIC LAPAROSCOPY WITH REMOVAL OF ECTOPIC PREGNANCY       Current Outpatient Medications:  .  albuterol (PROVENTIL HFA;VENTOLIN HFA) 108 (90 BASE) MCG/ACT inhaler, Inhale 1-2 puffs into the lungs every 6 (six) hours as needed for wheezing or shortness of breath., Disp: 1 Inhaler, Rfl: 0 .  benztropine (COGENTIN) 1 MG tablet, Take 1 mg by mouth 2 (two) times daily.,  Disp: , Rfl:  .  cholecalciferol (VITAMIN D3) 25 MCG (1000 UNIT) tablet, Take 1,000 Units by mouth daily., Disp: , Rfl:  .  folic acid (FOLVITE) 1 MG tablet, Take 1 mg by mouth daily. As needed, Disp: , Rfl:  .  haloperidol (HALDOL) 5 MG tablet, Take 5 mg by mouth 2 (two) times daily., Disp: , Rfl:  .  hydrOXYzine (ATARAX/VISTARIL) 50 MG tablet, Take one (1) tablet by mouth three times a day, Disp: , Rfl:  .  traZODone (DESYREL) 100 MG tablet, Take 200 mg by mouth at bedtime as needed., Disp: , Rfl:  .  FLUoxetine (PROZAC) 20 MG capsule, Take 20 mg by mouth daily. (Patient not taking: Reported on 04/07/2020), Disp: , Rfl: 0  Review of Systems Constitutional: negative Gastrointestinal: negative Genitourinary: negative  Objective:  BP (!) 146/101 (BP Location: Left Arm, Patient Position: Sitting, Cuff Size: Normal)   Pulse 81   Ht 5\' 4"  (1.626 m)   Wt 251 lb 12.8 oz (114.2 kg)   BMI 43.22 kg/m    BMI: Body mass index is 43.22 kg/m.  General Appearance: Alert, appropriate appearance for age. No acute distress HEENT: Grossly normal Neck / Thyroid:  Cardiovascular: RRR; normal S1, S2, no murmur Lungs: CTA bilaterally Back: No CVAT Breast Exam: Normal breast tissue bilaterally. Gastrointestinal: Soft, non-tender, no masses or organomegaly Pelvic Exam: Vulva and vagina appear normal. Bimanual exam reveals normal  uterus and adnexa. Good support. Nontender. Small uterus. Exam limited by body habitus. Rectovaginal: not indicated and guaiac negative stool obtained Lymphatic Exam: Non-palpable nodes in neck, clavicular, axillary, or inguinal regions  Skin: no rash or abnormalities Neurologic: Normal gait and speech, no tremor  Psychiatric: Alert and oriented, appropriate affect.  Urinalysis:Not done  Christin Bach. MD Pgr [phone] 11:16 AM  By signing my name below, I, Pietro Cassis, attest that this documentation has been prepared under the direction and in the presence of  Tilda Burrow, MD. Electronically Signed: Pietro Cassis, Medical Scribe. 04/07/20. 11:16 AM.  I personally performed the services described in this documentation, which was SCRIBED in my presence. The recorded information has been reviewed and considered accurate. It has been edited as necessary during review. Tilda Burrow, MD

## 2020-04-08 LAB — CYTOLOGY - PAP
Adequacy: ABSENT
Chlamydia: NEGATIVE
Comment: NEGATIVE
Comment: NEGATIVE
Comment: NORMAL
Diagnosis: NEGATIVE
High risk HPV: NEGATIVE
Neisseria Gonorrhea: NEGATIVE

## 2020-04-08 LAB — FOLLICLE STIMULATING HORMONE: FSH: 5.3 m[IU]/mL

## 2020-04-08 LAB — ESTRADIOL: Estradiol: 37.1 pg/mL

## 2020-04-08 LAB — HCG, SERUM, QUALITATIVE: hCG,Beta Subunit,Qual,Serum: NEGATIVE m[IU]/mL (ref <6)

## 2020-04-09 ENCOUNTER — Telehealth: Payer: Self-pay | Admitting: Obstetrics and Gynecology

## 2020-04-09 ENCOUNTER — Other Ambulatory Visit (HOSPITAL_COMMUNITY): Payer: Self-pay | Admitting: Obstetrics and Gynecology

## 2020-04-09 DIAGNOSIS — Z1231 Encounter for screening mammogram for malignant neoplasm of breast: Secondary | ICD-10-CM

## 2020-04-09 NOTE — Telephone Encounter (Signed)
Patient request someone to review bloodwork results.

## 2020-04-09 NOTE — Telephone Encounter (Signed)
Patient called stating that she would like a call from the nurse regarding her blood work. Please contact pt

## 2020-04-09 NOTE — Telephone Encounter (Signed)
Results to be reviewed by Dr. Emelda Fear and then we will call patient.

## 2020-04-11 NOTE — Telephone Encounter (Signed)
Attempted to call patient with results. Left message to call back for results.

## 2020-04-11 NOTE — Telephone Encounter (Signed)
[name] is not pregnant. She is not completely postmenopausal.  Which means pregnancy is not completely impossible, but very unlikely at age 44. If she is strongly desiring pregnancy , She would have a better chance if working with a fertility specialist, such as Dr Elesa Hacker of [facility]. If that is not an option. Perhaps reviewing online information available at [email or web address] is an option. She may make an appointment to discuss strategy, but I am not a fertility specialist.

## 2020-04-14 ENCOUNTER — Other Ambulatory Visit: Payer: Self-pay

## 2020-04-14 ENCOUNTER — Ambulatory Visit (HOSPITAL_COMMUNITY)
Admission: RE | Admit: 2020-04-14 | Discharge: 2020-04-14 | Disposition: A | Discharge status: Home / Self Care | Payer: Medicare Other | Source: Ambulatory Visit | Attending: Family Medicine | Admitting: Family Medicine

## 2020-04-14 DIAGNOSIS — Z1231 Encounter for screening mammogram for malignant neoplasm of breast: Secondary | ICD-10-CM | POA: Diagnosis present

## 2020-04-14 NOTE — Telephone Encounter (Signed)
Patient returned call and advised patient was not pregnant.Also advised patient not ompletely postmenopausal and that pregnancy is not completely impossible but most unlikely at age 44.  If she is desiring pregnancy Dr. Emelda Fear recommends Dr. Elesa Hacker. Also gave website information to patient. Patient voiced understanding.

## 2020-04-20 NOTE — Progress Notes (Signed)
Normal mammogram

## 2020-04-30 DIAGNOSIS — F251 Schizoaffective disorder, depressive type: Secondary | ICD-10-CM | POA: Diagnosis not present

## 2021-05-26 ENCOUNTER — Other Ambulatory Visit (HOSPITAL_COMMUNITY): Payer: Self-pay | Admitting: Adult Health

## 2021-05-26 DIAGNOSIS — Z1231 Encounter for screening mammogram for malignant neoplasm of breast: Secondary | ICD-10-CM

## 2021-06-08 ENCOUNTER — Other Ambulatory Visit: Payer: Self-pay

## 2021-06-08 ENCOUNTER — Ambulatory Visit (HOSPITAL_COMMUNITY)
Admission: RE | Admit: 2021-06-08 | Discharge: 2021-06-08 | Disposition: A | Discharge status: Home / Self Care | Payer: Medicare Other | Source: Ambulatory Visit | Attending: Adult Health | Admitting: Adult Health

## 2021-06-08 DIAGNOSIS — Z1231 Encounter for screening mammogram for malignant neoplasm of breast: Secondary | ICD-10-CM | POA: Insufficient documentation

## 2021-08-12 ENCOUNTER — Ambulatory Visit (HOSPITAL_COMMUNITY)
Admission: RE | Admit: 2021-08-12 | Discharge: 2021-08-12 | Disposition: A | Discharge status: Home / Self Care | Payer: Medicare Other | Source: Ambulatory Visit | Attending: Internal Medicine | Admitting: Internal Medicine

## 2021-08-12 ENCOUNTER — Other Ambulatory Visit: Payer: Self-pay

## 2021-08-12 ENCOUNTER — Other Ambulatory Visit (HOSPITAL_COMMUNITY): Payer: Self-pay | Admitting: Internal Medicine

## 2021-08-12 DIAGNOSIS — M25512 Pain in left shoulder: Secondary | ICD-10-CM

## 2021-12-06 ENCOUNTER — Encounter (HOSPITAL_COMMUNITY): Payer: Self-pay

## 2021-12-06 ENCOUNTER — Other Ambulatory Visit: Payer: Self-pay

## 2021-12-06 ENCOUNTER — Emergency Department (HOSPITAL_COMMUNITY)
Admission: EM | Admit: 2021-12-06 | Discharge: 2021-12-07 | Disposition: A | Discharge status: Home / Self Care | Payer: Medicare Other | Attending: Emergency Medicine | Admitting: Emergency Medicine

## 2021-12-06 DIAGNOSIS — J189 Pneumonia, unspecified organism: Secondary | ICD-10-CM

## 2021-12-06 DIAGNOSIS — R059 Cough, unspecified: Secondary | ICD-10-CM | POA: Diagnosis present

## 2021-12-06 DIAGNOSIS — I10 Essential (primary) hypertension: Secondary | ICD-10-CM | POA: Insufficient documentation

## 2021-12-06 DIAGNOSIS — J188 Other pneumonia, unspecified organism: Secondary | ICD-10-CM | POA: Insufficient documentation

## 2021-12-06 DIAGNOSIS — J45909 Unspecified asthma, uncomplicated: Secondary | ICD-10-CM | POA: Diagnosis not present

## 2021-12-06 DIAGNOSIS — F17210 Nicotine dependence, cigarettes, uncomplicated: Secondary | ICD-10-CM | POA: Diagnosis not present

## 2021-12-06 NOTE — ED Triage Notes (Signed)
Cough/cold s/s/ for 2 weeks. Goes to see pcp on the 10th.  Said her covid was negative

## 2021-12-07 ENCOUNTER — Emergency Department (HOSPITAL_COMMUNITY): Payer: Medicare Other

## 2021-12-07 DIAGNOSIS — J188 Other pneumonia, unspecified organism: Secondary | ICD-10-CM | POA: Diagnosis not present

## 2021-12-07 MED ORDER — AMOXICILLIN 500 MG PO CAPS: 500.0000 mg | ORAL_CAPSULE | Freq: Three times a day (TID) | ORAL | 0 refills | Status: AC

## 2021-12-07 MED ORDER — ALBUTEROL SULFATE HFA 108 (90 BASE) MCG/ACT IN AERS
2.0000 | INHALATION_SPRAY | Freq: Once | RESPIRATORY_TRACT | Status: AC
  Administered 2021-12-07: 2 via RESPIRATORY_TRACT
  Filled 2021-12-07: qty 6.7

## 2021-12-07 MED ORDER — AZITHROMYCIN 250 MG PO TABS: ORAL_TABLET | ORAL | 0 refills | Status: DC

## 2021-12-07 MED ORDER — PREDNISONE 20 MG PO TABS: 40.0000 mg | ORAL_TABLET | Freq: Every day | ORAL | 0 refills | Status: AC

## 2021-12-07 NOTE — ED Provider Notes (Signed)
[facility]-EMERGENCY DEPT [facility] Emergency Department Provider Note MRN:  [id number]  Arrival date & time: 12/07/21     Chief Complaint   Cough   History of Present Illness   [name] is a 46 y.o. year-old female with a history of bipolar disorder presenting to the ED with chief complaint of cough.  1 month of persistent cough.  Described as a chest cold.  Only short of breath when she is coughing.  No chest pain.  No fever, no other complaints.  Review of Systems  A thorough review of systems was obtained and all systems are negative except as noted in the HPI and PMH.   Patient's Health History    Past Medical History:  Diagnosis Date   Allergy    Anxiety    Arthritis    Asthma    Bipolar 1 disorder (HCC)    Depression    Ectopic pregnancy    Hepatitis B infection    Hyperlipidemia    Hypertension    Schizophrenia (HCC)     Past Surgical History:  Procedure Laterality Date   DIAGNOSTIC LAPAROSCOPY WITH REMOVAL OF ECTOPIC PREGNANCY      Family History  Problem Relation Age of Onset   Heart disease Mother    Stroke Mother    Asthma Mother    Arthritis Mother    Depression Mother    Diabetes Mother    Hyperlipidemia Mother    Hypertension Mother    Hypertension Father    Hyperlipidemia Father     Social History   Socioeconomic History   Marital status: Single    Spouse name: Not on file   Number of children: 0   Years of education: HS   Highest education level: Not on file  Occupational History   Occupation: DISABLED    Employer: UNEMPLOYED  Tobacco Use   Smoking status: Every Day    Packs/day: 2.00    Types: Cigarettes    Start date: 10/25/2005   Smokeless tobacco: Never  Vaping Use   Vaping Use: Never used  Substance and Sexual Activity   Alcohol use: No    Alcohol/week: 0.0 standard drinks   Drug use: No    Comment: 4 years ago   Sexual activity: Yes    Birth control/protection: None  Other Topics Concern   Not on file  Social  History Narrative   Patient lives at home with her mother and father   Liliane Shi to watch TV   Does not drive   Social Determinants of Health   Financial Resource Strain: Not on file  Food Insecurity: Not on file  Transportation Needs: Not on file  Physical Activity: Not on file  Stress: Not on file  Social Connections: Not on file  Intimate Partner Violence: Not on file     Physical Exam   Vitals:   12/07/21 0000  BP: (!) 149/82  Pulse: 71  Resp: 18  Temp: 98.4 F (36.9 C)  SpO2: 92%    CONSTITUTIONAL: Well-appearing, NAD NEURO/PSYCH:  Alert and oriented x 3, no focal deficits EYES:  eyes equal and reactive ENT/NECK:  no LAD, no JVD CARDIO: Regular rate, well-perfused, normal S1 and S2 PULM: Crackles left base, otherwise clear GI/GU:  non-distended, non-tender MSK/SPINE:  No gross deformities, no edema SKIN:  no rash, atraumatic   *Additional and/or pertinent findings included in MDM below  Diagnostic and Interventional Summary    EKG Interpretation  Date/Time:    Ventricular Rate:  PR Interval:    QRS Duration:   QT Interval:    QTC Calculation:   R Axis:     Text Interpretation:         Labs Reviewed - No data to display  DG Chest Port 1 View  Final Result      Medications  albuterol (VENTOLIN HFA) 108 (90 Base) MCG/ACT inhaler 2 puff (has no administration in time range)     Procedures  /  Critical Care Procedures  ED Course and Medical Decision Making  Initial Impression and Ddx X-ray to evaluate for pneumonia or signs of mass or neoplasm, suspect a lingering bronchitis.  Past medical/surgical history that increases complexity of ED encounter: None  Interpretation of Diagnostics I personally reviewed the Chest Xray and my interpretation is as follows: No pneumothorax, no lobar pneumonia    Radiology commenting on interstitial coarsening that may suggest atypical pneumonia  Patient Reassessment and Ultimate  Disposition/Management Patient continues to look well, appropriate for discharge on antibiotics.  Patient management required discussion with the following services or consulting groups:  None  Complexity of Problems Addressed Acute complicated illness or Injury  Additional Data Reviewed and Analyzed Further history obtained from: Past medical history and medications listed in the EMR  Additional Factors Impacting ED Encounter Risk Prescriptions  Elmer Sow. Pilar Plate, MD [facility] Emergency Medicine [facility] [email or web address]  Final Clinical Impressions(s) / ED Diagnoses     ICD-10-CM   1. Community acquired pneumonia, unspecified laterality  J18.9     2. Cough  R05.9 DG Chest Port 1 View    DG Chest Port 1 View      ED Discharge Orders          Ordered    amoxicillin (AMOXIL) 500 MG capsule  3 times daily        12/07/21 0042    azithromycin (ZITHROMAX) 250 MG tablet        12/07/21 0042    predniSONE (DELTASONE) 20 MG tablet  Daily        12/07/21 0042             Discharge Instructions Discussed with and Provided to Patient:     Discharge Instructions      You were evaluated in the Emergency Department and after careful evaluation, we did not find any emergent condition requiring admission or further testing in the hospital.  Your exam/testing today was overall reassuring.  Your x-ray is showing signs of pneumonia.  Take the amoxicillin and azithromycin antibiotics as directed.  We also recommend taking the prednisone medication to help with your cough and symptoms.  Can use the inhaler as needed for coughing fits/spells.  Please return to the Emergency Department if you experience any worsening of your condition.  Thank you for allowing [place] to be a part of your care.        Sabas Sous, MD 12/07/21 [phone]

## 2021-12-07 NOTE — Discharge Instructions (Signed)
You were evaluated in the Emergency Department and after careful evaluation, we did not find any emergent condition requiring admission or further testing in the hospital.  Your exam/testing today was overall reassuring.  Your x-ray is showing signs of pneumonia.  Take the amoxicillin and azithromycin antibiotics as directed.  We also recommend taking the prednisone medication to help with your cough and symptoms.  Can use the inhaler as needed for coughing fits/spells.  Please return to the Emergency Department if you experience any worsening of your condition.  Thank you for allowing [place] to be a part of your care.

## 2022-01-14 ENCOUNTER — Encounter: Payer: Self-pay | Admitting: *Deleted

## 2022-03-16 ENCOUNTER — Ambulatory Visit (INDEPENDENT_AMBULATORY_CARE_PROVIDER_SITE_OTHER): Payer: Self-pay | Admitting: *Deleted

## 2022-03-16 VITALS — Ht 64.0 in | Wt 250.0 lb

## 2022-03-16 DIAGNOSIS — Z1211 Encounter for screening for malignant neoplasm of colon: Secondary | ICD-10-CM

## 2022-03-16 NOTE — Progress Notes (Signed)
ASA 3 only due to BMI, okay to schedule procedure.

## 2022-03-16 NOTE — Progress Notes (Addendum)
Gastroenterology Pre-Procedure Review  Request Date: 03/16/2022 Requesting Physician: Blenda Nicely, NP, no previous TCS  PATIENT REVIEW QUESTIONS: The patient responded to the following health history questions as indicated:    1. Diabetes Melitis: no 2. Joint replacements in the past 12 months: no 3. Major health problems in the past 3 months: yes, pneumonia 4. Has an artificial valve or MVP: no 5. Has a defibrillator: no 6. Has been advised in past to take antibiotics in advance of a procedure like teeth cleaning: no 7. Family history of colon cancer: no 8. Alcohol Use: no 9. Illicit drug Use: no 10. History of sleep apnea: no  11. History of coronary artery or other vascular stents placed within the last 12 months: no 12. History of any prior anesthesia complications: no 13. Body mass index is 42.91 kg/m.    MEDICATIONS & ALLERGIES:    Patient reports the following regarding taking any blood thinners:   Plavix? no Aspirin? no Coumadin? no Brilinta? no Xarelto? no Eliquis? no Pradaxa? no Savaysa? no Effient? no  Patient confirms/reports the following medications:  Current Outpatient Medications  Medication Sig Dispense Refill   albuterol (PROVENTIL HFA;VENTOLIN HFA) 108 (90 BASE) MCG/ACT inhaler Inhale 1-2 puffs into the lungs every 6 (six) hours as needed for wheezing or shortness of breath. 1 Inhaler 0   benztropine (COGENTIN) 1 MG tablet Take 1 mg by mouth 2 (two) times daily.     cholecalciferol (VITAMIN D3) 25 MCG (1000 UNIT) tablet Take 1,000 Units by mouth daily.     citalopram (CELEXA) 20 MG tablet Take 20 mg by mouth daily.     haloperidol (HALDOL) 5 MG tablet Take 5 mg by mouth 2 (two) times daily.     hydrOXYzine (ATARAX/VISTARIL) 50 MG tablet Take one (1) tablet by mouth three times a day     ibuprofen (ADVIL) 200 MG tablet Take 200 mg by mouth as needed. Takes 2 tablets as needed.     meloxicam (MOBIC) 7.5 MG tablet Take 7.5 mg by mouth 2 (two) times daily as  needed.     traZODone (DESYREL) 100 MG tablet Take 200 mg by mouth at bedtime as needed.     No current facility-administered medications for this visit.    Patient confirms/reports the following allergies:  Allergies  Allergen Reactions   Acetaminophen Other (See Comments)   Banana    Tomato     swelling    No orders of the defined types were placed in this encounter.   AUTHORIZATION INFORMATION Primary Insurance: [facility] Medicare,  ID #: [id number],  Group #: [id number] Pre-Cert / Auth required: Yes, approved online [id number] Pre-Cert / Josem Kaufmann #: [id number]  Secondary Insurance: Medicaid [employer],  ID #: [id number] Pre-Cert / Josem Kaufmann required: No, not required  SCHEDULE INFORMATION: Procedure has been scheduled as follows:  Date: 05/10/2022, Time:  10:00 Location: [facility] with Dr. Abbey Chatters  This Gastroenterology Pre-Precedure Review Form is being routed to the following provider(s): Venetia Night, NP

## 2022-03-17 NOTE — Progress Notes (Signed)
Called pt to schedule her procedure for June.  Pt requested to have a July procedure.  She said she has a lot going on right now. Informed her that I would call her once procedure schedules are available.

## 2022-03-24 ENCOUNTER — Encounter: Payer: Self-pay | Admitting: *Deleted

## 2022-03-24 MED ORDER — PEG 3350-KCL-NA BICARB-NACL 420 G PO SOLR: 4000.0000 mL | Freq: Once | ORAL | 0 refills | Status: AC

## 2022-03-24 NOTE — Addendum Note (Signed)
Addended by: Noreene Larsson on: 03/24/2022 12:03 PM   Modules accepted: Orders

## 2022-03-24 NOTE — Progress Notes (Signed)
Spoke to pt.  She was made aware of Pre-op 05/06/2022 at 9:30 at [facility].  Scheduled procedure for 05/10/2022.  She was made aware that she will be given procedure time at her Pre-op appointment.  Reviewed prep instructions with pt by phone.  Confirmed mailing address and mailed instructions.

## 2022-03-25 ENCOUNTER — Telehealth: Payer: Self-pay | Admitting: *Deleted

## 2022-03-25 ENCOUNTER — Other Ambulatory Visit: Payer: Self-pay | Admitting: *Deleted

## 2022-03-25 NOTE — Telephone Encounter (Signed)
Pt called and informed me that she would like to cancel her procedure.  She informed me that she is just going to do the cologuard test.  [name] in Endo and made her aware by voice mail.  Removed from our schedule.

## 2022-04-13 IMAGING — DX DG SHOULDER 2+V*L*
3 series · 3 of 3 positions shown · non-contrast
Comparison: None.

CLINICAL DATA: Left shoulder pain for several days, initial
encounter

EXAM:
LEFT SHOULDER - 2+ VIEW

[shoulder grashey]
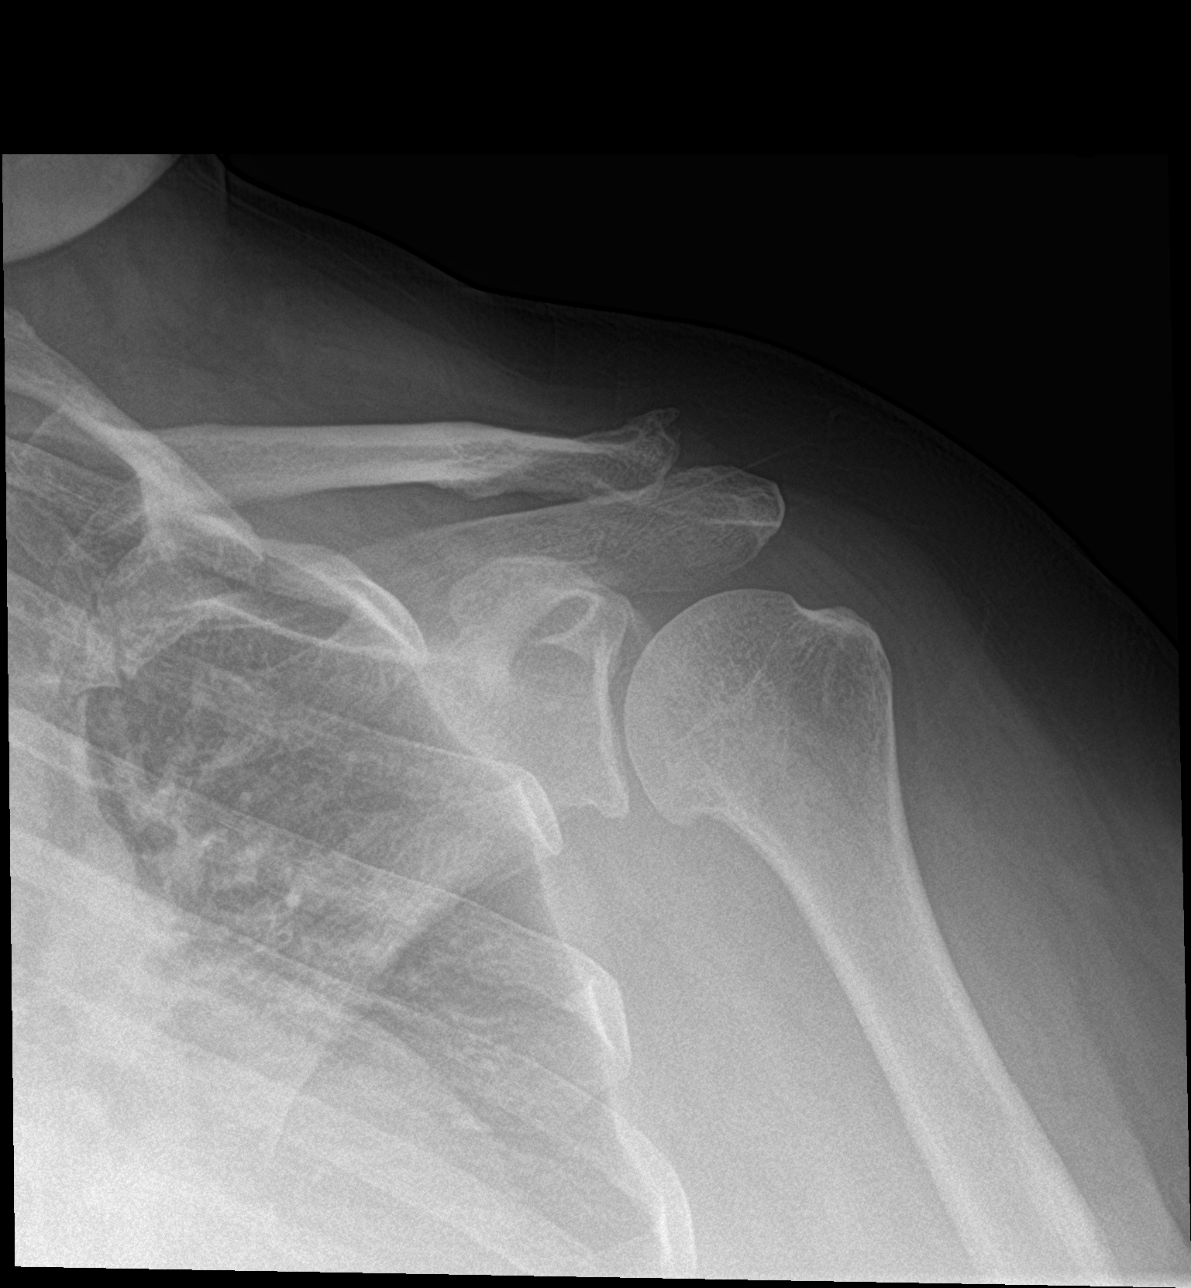

[shoulder y view]
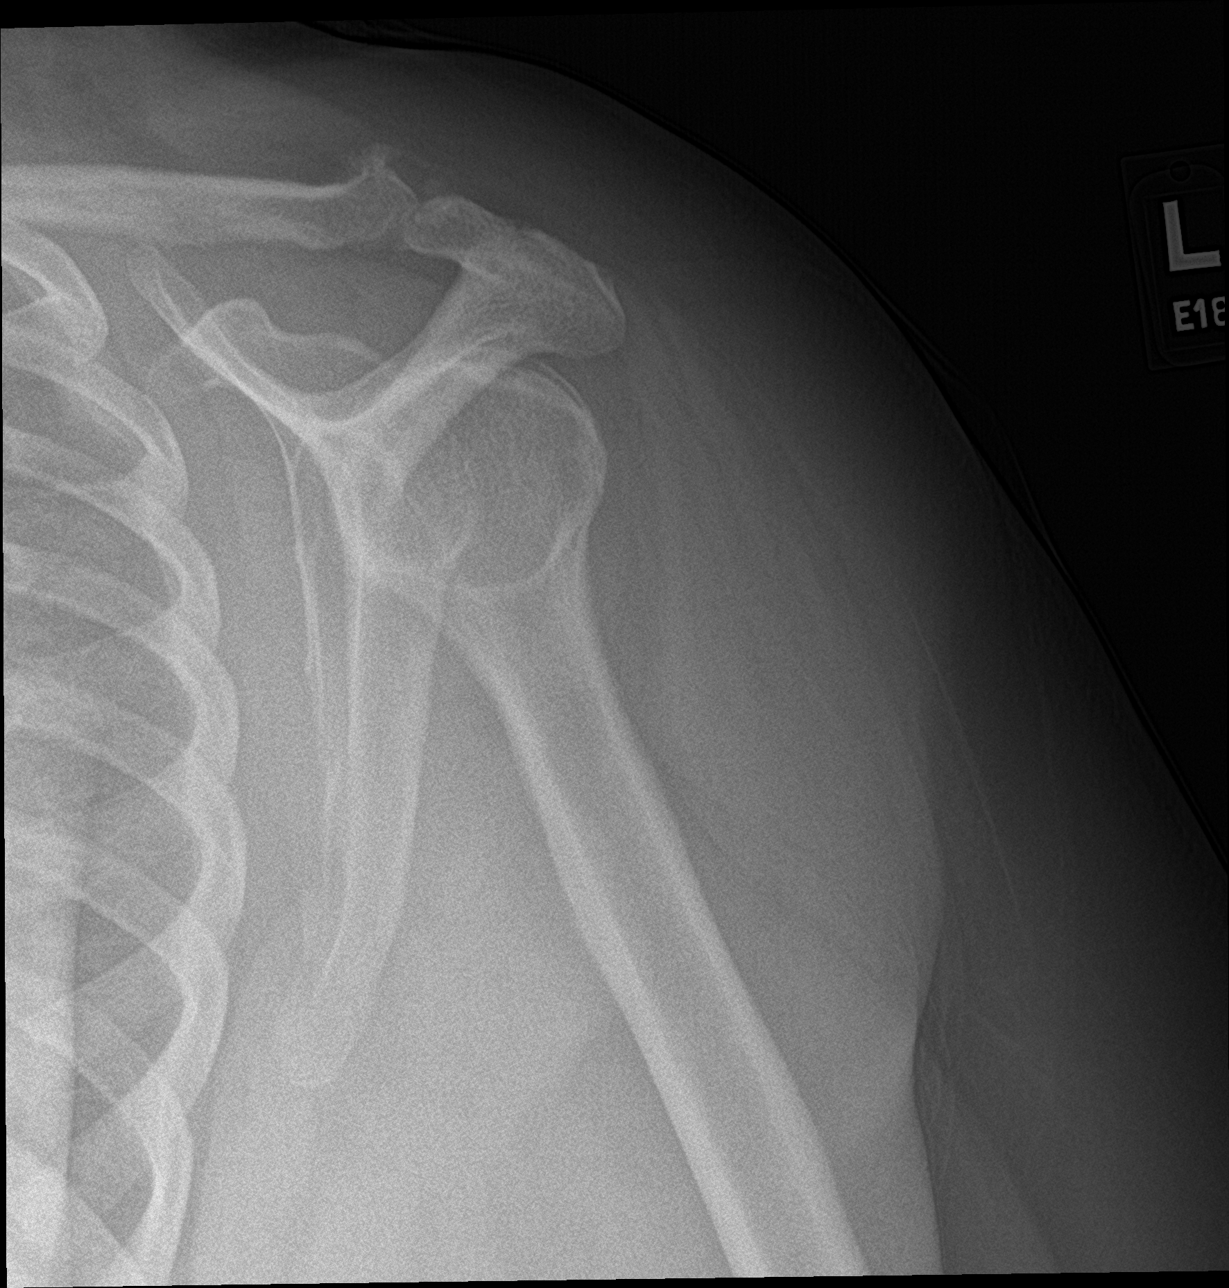

[shoulder axillary]
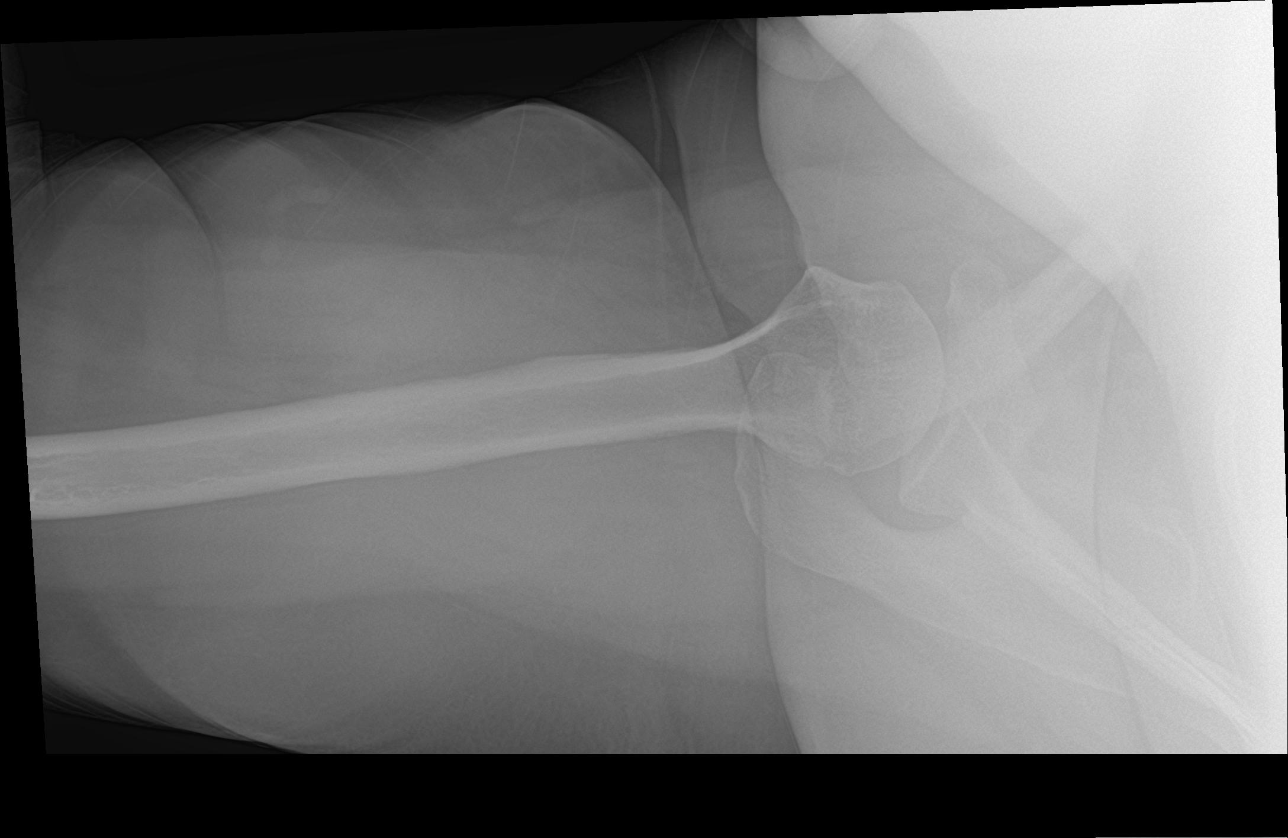

[3 of 3 positions shown; findings below may reference images not displayed]

FINDINGS: Degenerative changes of the acromioclavicular joint are seen. No
acute fracture or dislocation is noted. No soft tissue abnormality
is seen.
IMPRESSION: No acute abnormality noted.

## 2022-04-30 ENCOUNTER — Other Ambulatory Visit (HOSPITAL_COMMUNITY): Payer: Self-pay | Admitting: Nurse Practitioner

## 2022-04-30 DIAGNOSIS — Z1231 Encounter for screening mammogram for malignant neoplasm of breast: Secondary | ICD-10-CM

## 2022-05-06 ENCOUNTER — Other Ambulatory Visit (HOSPITAL_COMMUNITY): Payer: Medicare Other

## 2022-05-10 ENCOUNTER — Ambulatory Visit (HOSPITAL_COMMUNITY): Admit: 2022-05-10 | Payer: Medicare Other

## 2022-05-10 ENCOUNTER — Encounter (HOSPITAL_COMMUNITY): Payer: Self-pay

## 2022-05-10 SURGERY — COLONOSCOPY WITH PROPOFOL
Anesthesia: Monitor Anesthesia Care

## 2022-06-11 ENCOUNTER — Ambulatory Visit (HOSPITAL_COMMUNITY)
Admission: RE | Admit: 2022-06-11 | Discharge: 2022-06-11 | Disposition: A | Discharge status: Home / Self Care | Payer: Medicare Other | Source: Ambulatory Visit | Attending: Nurse Practitioner | Admitting: Nurse Practitioner

## 2022-06-11 DIAGNOSIS — Z1231 Encounter for screening mammogram for malignant neoplasm of breast: Secondary | ICD-10-CM | POA: Insufficient documentation

## 2022-07-29 ENCOUNTER — Encounter (HOSPITAL_COMMUNITY): Payer: Self-pay

## 2022-07-29 ENCOUNTER — Emergency Department (HOSPITAL_COMMUNITY)
Admission: EM | Admit: 2022-07-29 | Discharge: 2022-07-29 | Disposition: A | Discharge status: Home / Self Care | Payer: Medicare Other | Attending: Emergency Medicine | Admitting: Emergency Medicine

## 2022-07-29 ENCOUNTER — Other Ambulatory Visit: Payer: Self-pay

## 2022-07-29 DIAGNOSIS — R42 Dizziness and giddiness: Secondary | ICD-10-CM | POA: Insufficient documentation

## 2022-07-29 DIAGNOSIS — R001 Bradycardia, unspecified: Secondary | ICD-10-CM | POA: Diagnosis not present

## 2022-07-29 DIAGNOSIS — R519 Headache, unspecified: Secondary | ICD-10-CM | POA: Diagnosis not present

## 2022-07-29 LAB — BASIC METABOLIC PANEL
Anion gap: 7 (ref 5–15)
BUN: 6 mg/dL (ref 6–20)
CO2: 29 mmol/L (ref 22–32)
Calcium: 8.9 mg/dL (ref 8.9–10.3)
Chloride: 102 mmol/L (ref 98–111)
Creatinine, Ser: 1.06 mg/dL — ABNORMAL HIGH (ref 0.44–1.00)
GFR, Estimated: >60 mL/min (ref >60)
Glucose, Bld: 86 mg/dL (ref 70–99)
Potassium: 3.7 mmol/L (ref 3.5–5.1)
Sodium: 138 mmol/L (ref 135–145)

## 2022-07-29 LAB — URINALYSIS, ROUTINE W REFLEX MICROSCOPIC
Bilirubin Urine: NEGATIVE
Glucose, UA: NEGATIVE mg/dL
Hgb urine dipstick: NEGATIVE
Ketones, ur: NEGATIVE mg/dL
Leukocytes,Ua: NEGATIVE
Nitrite: NEGATIVE
Protein, ur: NEGATIVE mg/dL
Specific Gravity, Urine: <1.005 — ABNORMAL LOW (ref 1.005–1.030)
pH: 6 (ref 5.0–8.0)

## 2022-07-29 LAB — CBC
HCT: 43.1 % (ref 36.0–46.0)
Hemoglobin: 14 g/dL (ref 12.0–15.0)
MCH: 28.8 pg (ref 26.0–34.0)
MCHC: 32.5 g/dL (ref 30.0–36.0)
MCV: 88.7 fL (ref 80.0–100.0)
Platelets: 201 10*3/uL (ref 150–400)
RBC: 4.86 MIL/uL (ref 3.87–5.11)
RDW: 14.8 % (ref 11.5–15.5)
WBC: 10.5 10*3/uL (ref 4.0–10.5)
nRBC: 0 % (ref 0.0–0.2)

## 2022-07-29 LAB — HCG, SERUM, QUALITATIVE: Preg, Serum: NEGATIVE

## 2022-07-29 LAB — CBG MONITORING, ED: Glucose-Capillary: 98 mg/dL (ref 70–99)

## 2022-07-29 MED ORDER — MECLIZINE HCL 12.5 MG PO TABS
25.0000 mg | ORAL_TABLET | Freq: Once | ORAL | Status: AC
  Administered 2022-07-29: 25 mg via ORAL
  Filled 2022-07-29: qty 2

## 2022-07-29 MED ORDER — MECLIZINE HCL 25 MG PO TABS
25.0000 mg | ORAL_TABLET | Freq: Three times a day (TID) | ORAL | 0 refills | Status: AC | PRN
Start: 2022-07-29 — End: ?

## 2022-07-29 NOTE — ED Provider Notes (Signed)
[facility] EMERGENCY DEPARTMENT Provider Note   CSN: [id number] Arrival date & time: 07/29/22  1359     History  Chief Complaint  Patient presents with   Dizziness    [name] is a 46 y.o. female who presents to the ED complaining of dizziness that started approximately 1 hour ago.  Patient states she was with family getting something to eat when she began to feel dizzy.  Patient states she did fall while feeling dizzy but did not sustain any injury.  She describes it as the room is spinning.  Denies feeling lightheaded or like she may pass out.  States that she has also been having a headache on and off for about a week.  Denies fever, chills, weakness, facial droop, difficulty swallowing, difficulty speaking/slurred speech, neck stiffness or limited ROM.  Patient states that she has a history of vertigo and this episode felt very similar to previous vertigo episodes.      Home Medications Prior to Admission medications   Medication Sig Start Date End Date Taking? Authorizing Provider  albuterol (PROVENTIL HFA;VENTOLIN HFA) 108 (90 BASE) MCG/ACT inhaler Inhale 1-2 puffs into the lungs every 6 (six) hours as needed for wheezing or shortness of breath. 03/13/15   Zadie Rhine, MD  benztropine (COGENTIN) 1 MG tablet Take 1 mg by mouth 2 (two) times daily. 02/24/20   [provider]  cholecalciferol (VITAMIN D3) 25 MCG (1000 UNIT) tablet Take 1,000 Units by mouth daily.    [provider]  citalopram (CELEXA) 20 MG tablet Take 20 mg by mouth daily. 02/27/22   [provider]  haloperidol (HALDOL) 5 MG tablet Take 5 mg by mouth 2 (two) times daily. 02/24/20   [provider]  hydrOXYzine (ATARAX/VISTARIL) 50 MG tablet Take one (1) tablet by mouth three times a day 05/30/19   [provider]  ibuprofen (ADVIL) 200 MG tablet Take 200 mg by mouth as needed. Takes 2 tablets as needed.    [provider]  meloxicam (MOBIC) 7.5 MG tablet  Take 7.5 mg by mouth 2 (two) times daily as needed. 02/15/22   [provider]  traZODone (DESYREL) 100 MG tablet Take 200 mg by mouth at bedtime as needed. 02/24/20   [provider]      Allergies    Acetaminophen, Banana, and Tomato    Review of Systems   Review of Systems  Constitutional:  Negative for fever.  Neurological:  Positive for dizziness and headaches. Negative for syncope, weakness and light-headedness.    Physical Exam Updated Vital Signs BP 134/89 (BP Location: Right Arm)   Pulse (!) 57   Temp 97.9 F (36.6 C) (Oral)   Resp 16   Ht 5\' 2"  (1.575 m)   Wt 111.1 kg   SpO2 98%   BMI 44.81 kg/m  Physical Exam Vitals and nursing note reviewed.  Constitutional:      General: She is not in acute distress.    Appearance: Normal appearance. She is obese. She is not ill-appearing or diaphoretic.  HENT:     Head: Normocephalic and atraumatic.     Mouth/Throat:     Mouth: Mucous membranes are moist.     Pharynx: Oropharynx is clear.  Eyes:     Extraocular Movements:     Right eye: Nystagmus present. Normal extraocular motion.     Left eye: Nystagmus present. Normal extraocular motion.     Conjunctiva/sclera: Conjunctivae normal.     Comments: Mild bilateral horizontal  nystagmus noted when having patient turn head and look to the right  Cardiovascular:     Rate and Rhythm: Regular rhythm. Bradycardia present.     Pulses: Normal pulses.     Heart sounds: Normal heart sounds.  Pulmonary:     Effort: Pulmonary effort is normal.  Musculoskeletal:     Cervical back: Full passive range of motion without pain.  Skin:    General: Skin is warm and dry.     Capillary Refill: Capillary refill takes less than 2 seconds.  Neurological:     Mental Status: She is alert. Mental status is at baseline.     Cranial Nerves: Cranial nerves 2-12 are intact.     Sensory: Sensation is intact.     Motor: Motor function is intact.     Coordination: Coordination is  intact.  Psychiatric:        Mood and Affect: Mood normal.        Behavior: Behavior normal.     ED Results / Procedures / Treatments   Labs (all labs ordered are listed, but only abnormal results are displayed) Labs Reviewed  BASIC METABOLIC PANEL  CBC  URINALYSIS, ROUTINE W REFLEX MICROSCOPIC  CBG MONITORING, ED  POC URINE PREG, ED    EKG None  Radiology No results found.  Procedures Procedures    Medications Ordered in ED Medications - No data to display  ED Course/ Medical Decision Making/ A&P Clinical Course as of 07/29/22 1639  Thu Jul 29, 2022  1638 hCG, serum, qualitative [MC]    Clinical Course User Index [MC] Lenard Simmer, [place]                           Medical Decision Making Amount and/or Complexity of Data Reviewed Labs: ordered. Decision-making details documented in ED Course.   This patient presents to the ED for concern of dizziness differential diagnosis includes vertigo with a peripheral cause.  Less suspicious for CVA due to normal neuro exam.   Lab Tests:  I ordered and personally interpreted labs.  The pertinent results include: Glucose 98   Medicines ordered and prescription drug management:  I ordered medication including meclizine for vertigo/dizziness Reevaluation of the patient after these medicines showed that the patient improved I have reviewed the patients home medicines and have made adjustments as needed   Problem List / ED Course:  Dizziness consistent with vertigo I treated patient's dizziness with meclizine while in the ED.  Will reevaluate to see if symptoms have improved.   Social Determinants of Health:  Current smoker, obesity  Patient presents to ED with episode of dizziness that resulted in a fall, no sustained injuries.  Patient does have a history of vertigo.  She had a normal neuro exam, no plan to order CT or other imaging at this time as I am less suspicious of CVA.  Labs are reassuring.  Examination  revealed mild nystagmus while patient turning head to the right which is consistent with differential diagnosis of vertigo.  Bradycardia noted on EKG, however, patient is not symptomatic with chest pain, lightheadedness, syncope, or palpitations.  Labs are reassuring at this time.  Will discharge patient home with a prescription of meclizine and advised to follow-up with P CP or ENT if symptoms persist.        Final Clinical Impression(s) / ED Diagnoses Final diagnoses:  None    Rx / DC Orders ED Discharge Orders  None         Pat Kocher, [place] 07/29/22 1753    Fredia Sorrow, MD 08/03/22 1049

## 2022-07-29 NOTE — ED Notes (Signed)
Pt in bed, pt denies pain and dizziness, states that she is ready to go home, pt verbalized understanding d/c and follow up, pt ambulatory from dpt.

## 2022-07-29 NOTE — ED Triage Notes (Signed)
Pt c/o dizziness that started about an hour ago. Pt reports it feels like the room is spinning.

## 2022-07-29 NOTE — ED Notes (Signed)
Pt denies dizziness at this time, pt states that she is feeling better, pt ambulatory to bathroom

## 2022-07-29 NOTE — Discharge Instructions (Addendum)
You were seen and treated in the ED today for dizziness.  You received a medication called meclizine which is to help with vertigo symptoms.  I am sending you home with a prescription of meclizine that you can take whenever you do have dizziness spells.  You should follow-up with your PCP or ENT regarding your dizziness.  Return to ED if new or worsening symptoms develop.

## 2023-02-12 ENCOUNTER — Other Ambulatory Visit: Payer: Self-pay

## 2023-02-12 ENCOUNTER — Emergency Department (HOSPITAL_COMMUNITY)
Admission: EM | Admit: 2023-02-12 | Discharge: 2023-02-12 | Disposition: A | Discharge status: Home / Self Care | Payer: 59 | Attending: Emergency Medicine | Admitting: Emergency Medicine

## 2023-02-12 ENCOUNTER — Encounter (HOSPITAL_COMMUNITY): Payer: Self-pay

## 2023-02-12 DIAGNOSIS — I10 Essential (primary) hypertension: Secondary | ICD-10-CM | POA: Insufficient documentation

## 2023-02-12 DIAGNOSIS — R21 Rash and other nonspecific skin eruption: Secondary | ICD-10-CM | POA: Diagnosis present

## 2023-02-12 DIAGNOSIS — J45909 Unspecified asthma, uncomplicated: Secondary | ICD-10-CM | POA: Diagnosis not present

## 2023-02-12 DIAGNOSIS — N61 Mastitis without abscess: Secondary | ICD-10-CM | POA: Diagnosis not present

## 2023-02-12 MED ORDER — CEPHALEXIN 500 MG PO CAPS
500.0000 mg | ORAL_CAPSULE | Freq: Once | ORAL | Status: AC
Start: 2023-02-12 — End: 2023-02-12
  Administered 2023-02-12: 500 mg via ORAL
  Filled 2023-02-12: qty 1

## 2023-02-12 MED ORDER — CEPHALEXIN 500 MG PO CAPS: 500.0000 mg | ORAL_CAPSULE | Freq: Four times a day (QID) | ORAL | 0 refills | Status: DC

## 2023-02-12 MED ORDER — ZINC OXIDE 20 % EX OINT: 1.0000 | TOPICAL_OINTMENT | CUTANEOUS | 0 refills | Status: DC | PRN

## 2023-02-12 NOTE — Discharge Instructions (Addendum)
Please take your antibiotics as prescribed. Take tylenol/ibuprofen for pain. I recommend close follow-up with PCP for reevaluation.  Please do not hesitate to return to emergency department if worrisome signs symptoms we discussed become apparent.  

## 2023-02-12 NOTE — ED Triage Notes (Signed)
Pt has a rash under her R breast x 2 weeks. Has been taking nystatin with no relief, has also run out of prescription and has been unable to get a refill.

## 2023-02-12 NOTE — ED Provider Notes (Signed)
[place] EMERGENCY DEPARTMENT AT [facility] Provider Note   CSN: [id number] Arrival date & time: 02/12/23  1658     History  Chief Complaint  Patient presents with   Rash    [name] is a 47 y.o. female with a past medical history of arthritis, asthma, bipolar 1 disorder, hyperlipidemia, hypertension presents today for evaluation of a rash.  Patient reports she has had a red rash under her right breast for the last 2 weeks.  Patient has tried nystatin powder every day for the past 2 weeks with no relief.  In fact the rash has gotten worse.  She reports the rash is painful and itching.  She denies any fever.  Denies any history of diabetes.  Patient is not breast-feeding.   Rash     Past Medical History:  Diagnosis Date   Allergy    Anxiety    Arthritis    Asthma    Bipolar 1 disorder    Depression    Ectopic pregnancy    Hepatitis B infection    Hyperlipidemia    Hypertension    Schizophrenia    Past Surgical History:  Procedure Laterality Date   DIAGNOSTIC LAPAROSCOPY WITH REMOVAL OF ECTOPIC PREGNANCY       Home Medications Prior to Admission medications   Medication Sig Start Date End Date Taking? Authorizing Provider  citalopram (CELEXA) 20 MG tablet Take by mouth. 10/28/22  Yes [provider]  haloperidol (HALDOL) 5 MG tablet Take by mouth. 10/28/22  Yes [provider]  methocarbamol (ROBAXIN) 500 MG tablet  05/26/22  Yes [provider]  albuterol (PROVENTIL HFA;VENTOLIN HFA) 108 (90 BASE) MCG/ACT inhaler Inhale 1-2 puffs into the lungs every 6 (six) hours as needed for wheezing or shortness of breath. 03/13/15   Zadie Rhine, MD  benztropine (COGENTIN) 1 MG tablet Take 1 mg by mouth 2 (two) times daily. 02/24/20   [provider]  cholecalciferol (VITAMIN D3) 25 MCG (1000 UNIT) tablet Take 1,000 Units by mouth daily.    [provider]  citalopram (CELEXA) 20 MG tablet Take 20 mg by mouth daily.  02/27/22   [provider]  haloperidol (HALDOL) 5 MG tablet Take 5 mg by mouth 2 (two) times daily. 02/24/20   [provider]  hydrOXYzine (ATARAX/VISTARIL) 50 MG tablet Take one (1) tablet by mouth three times a day 05/30/19   [provider]  ibuprofen (ADVIL) 200 MG tablet Take 200 mg by mouth as needed. Takes 2 tablets as needed.    [provider]  meclizine (ANTIVERT) 25 MG tablet Take 1 tablet (25 mg total) by mouth 3 (three) times daily as needed for dizziness. 07/29/22   Clark, Meghan R, PA-C  meloxicam (MOBIC) 7.5 MG tablet Take 7.5 mg by mouth 2 (two) times daily as needed. 02/15/22   [provider]  traZODone (DESYREL) 100 MG tablet Take 200 mg by mouth at bedtime as needed. 02/24/20   [provider]      Allergies    Acetaminophen, Banana, and Tomato    Review of Systems   Review of Systems  Skin:  Positive for rash.    Physical Exam Updated Vital Signs BP (!) 154/72 (BP Location: Right Arm)   Temp 99.2 F (37.3 C) (Oral)   Resp 16   SpO2 97%  Physical Exam Vitals and nursing note reviewed.  Constitutional:      Appearance: Normal appearance.  HENT:  Head: Normocephalic and atraumatic.     Mouth/Throat:     Mouth: Mucous membranes are moist.  Eyes:     General: No scleral icterus. Cardiovascular:     Rate and Rhythm: Normal rate and regular rhythm.     Pulses: Normal pulses.     Heart sounds: Normal heart sounds.  Pulmonary:     Effort: Pulmonary effort is normal.     Breath sounds: Normal breath sounds.  Abdominal:     General: Abdomen is flat.     Palpations: Abdomen is soft.     Tenderness: There is no abdominal tenderness.  Musculoskeletal:        General: No deformity.  Skin:    General: Skin is warm.     Findings: No rash.     Comments: Skin erythema under left breast.  The skin appears moist with white patches.  No apparent abscess or fluctuant mass noted on examination.  Neurological:      General: No focal deficit present.     Mental Status: She is alert.  Psychiatric:        Mood and Affect: Mood normal.     ED Results / Procedures / Treatments   Labs (all labs ordered are listed, but only abnormal results are displayed) Labs Reviewed - No data to display  EKG None  Radiology No results found.  Procedures Procedures    Medications Ordered in ED Medications - No data to display  ED Course/ Medical Decision Making/ A&P                             Medical Decision Making  This patient presents to the ED for rash, this involves an extensive number of treatment options, and is a complaint that carries with a high risk of complications and morbidity.  The differential diagnosis includes cellulitis, erysipelas, mastitis, eczema, TEN/SJS.  This is not an exhaustive list.  Problem list/ ED course/ Critical interventions/ Medical management: HPI: See above Vital signs within normal range and stable throughout visit. Laboratory/imaging studies significant for: See above. On physical examination, patient is afebrile and appears in no acute distress. This patient presents with initial presentation of local erythema, warmth under the R breast concerning for cellulitis. Sensitivity/pain to light touch around the erythematous area. No lymphangitic spread visible and no fluid pockets or fluctuance concerning for abscess noted. No immune compromise, bullae, pain out of proportion, or rapid progression concerning for necrotizing fasciitis. History and exam findings not consistent with dangerous etiologies of rash such as SJS/TEN, or secondary dangerous causes such as petechial rashes from thrombocytopenia or rickettsial infections. Rash does not appear urticarial with no signs of anaphylaxis either. Patient has tried nystatin powder for fungal infection with no relief.  I will send an Rx of Keflex for cellulitis and zinc oxide. 1st dose of Keflex given. Patient to be discharged  home with keflex with follow up with their PCP. I have reviewed the patient home medicines and have made adjustments as needed.  Cardiac monitoring/EKG: The patient was maintained on a cardiac monitor.  I personally reviewed and interpreted the cardiac monitor which showed an underlying rhythm of: sinus rhythm.  Additional history obtained: External records from outside source obtained and reviewed including: Chart review including previous notes, labs, imaging.  Consultations obtained:  Disposition Continued outpatient therapy. Follow-up with PCP recommended for reevaluation of symptoms. Treatment plan discussed with patient.  Pt acknowledged understanding was agreeable to  the plan. Worrisome signs and symptoms were discussed with patient, and patient acknowledged understanding to return to the ED if they noticed these signs and symptoms. Patient was stable upon discharge.   This chart was dictated using voice recognition software.  Despite best efforts to proofread,  errors can occur which can change the documentation meaning.          Final Clinical Impression(s) / ED Diagnoses Final diagnoses:  Rash  Cellulitis of female breast    Rx / DC Orders ED Discharge Orders          Ordered    zinc oxide [facility] ZINC OXIDE) 20 % ointment  As needed        02/12/23 1826    cephALEXin (KEFLEX) 500 MG capsule  4 times daily        02/12/23 1826              Jeanelle Malling, PA 02/12/23 Carlis Stable    Derwood Kaplan, MD 02/17/23 [id number]

## 2023-03-17 ENCOUNTER — Encounter: Payer: Self-pay | Admitting: *Deleted

## 2023-03-24 ENCOUNTER — Encounter: Payer: Self-pay | Admitting: Adult Health

## 2023-03-24 ENCOUNTER — Other Ambulatory Visit (HOSPITAL_COMMUNITY)
Admission: RE | Admit: 2023-03-24 | Discharge: 2023-03-24 | Disposition: A | Discharge status: Home / Self Care | Payer: 59 | Source: Ambulatory Visit | Attending: Adult Health | Admitting: Adult Health

## 2023-03-24 ENCOUNTER — Ambulatory Visit (INDEPENDENT_AMBULATORY_CARE_PROVIDER_SITE_OTHER): Payer: 59 | Admitting: Adult Health

## 2023-03-24 VITALS — BP 146/90 | HR 60 | Ht 64.0 in | Wt 255.0 lb

## 2023-03-24 DIAGNOSIS — I10 Essential (primary) hypertension: Secondary | ICD-10-CM | POA: Insufficient documentation

## 2023-03-24 DIAGNOSIS — K5900 Constipation, unspecified: Secondary | ICD-10-CM | POA: Insufficient documentation

## 2023-03-24 DIAGNOSIS — Z1151 Encounter for screening for human papillomavirus (HPV): Secondary | ICD-10-CM | POA: Insufficient documentation

## 2023-03-24 DIAGNOSIS — Z1211 Encounter for screening for malignant neoplasm of colon: Secondary | ICD-10-CM | POA: Diagnosis not present

## 2023-03-24 DIAGNOSIS — Z01419 Encounter for gynecological examination (general) (routine) without abnormal findings: Secondary | ICD-10-CM | POA: Diagnosis not present

## 2023-03-24 HISTORY — DX: Encounter for screening for malignant neoplasm of colon: Z12.11

## 2023-03-24 LAB — HEMOCCULT GUIAC POC 1CARD (OFFICE): Fecal Occult Blood, POC: NEGATIVE

## 2023-03-24 NOTE — Progress Notes (Signed)
Patient ID: [name], female   DOB: 1976/10/21, 47 y.o.   MRN: [id number] History of Present Illness: [name] is a 47 year old white female, single, G1P0010, in for a well woman gyn exam and pap.  PCP is Personal assistant at [employer]   Current Medications, Allergies, Past Medical History, Past Surgical History, Family History and Social History were reviewed in [employer] record.     Review of Systems: Patient denies any headaches, hearing loss, fatigue, blurred vision, shortness of breath, chest pain, abdominal pain, problems with bowel movements(hard at times and goes 2-3 x a week), urination, or intercourse.(Not having sex). No joint pain or mood swings.     Physical Exam:BP (!) 146/90 (BP Location: Left Arm, Patient Position: Sitting, Cuff Size: Large)   Pulse 60   Ht 5\' 4"  (1.626 m)   Wt 255 lb (115.7 kg)   BMI 43.77 kg/m   General:  Well developed, well nourished, no acute distress Skin:  Warm and dry Neck:  Midline trachea, normal thyroid, good ROM, no lymphadenopathy Lungs; Clear to auscultation bilaterally Breast:  No dominant palpable mass, retraction, or nipple discharge, has mild skin fungus under breasts, using nystatin powders  Cardiovascular: Regular rate and rhythm Abdomen:  Soft, non tender, no hepatosplenomegaly Pelvic:  External genitalia is normal in appearance, no lesions.  The vagina is normal in appearance, has white discharge without odor. Urethra has no lesions or masses. The cervix is smooth, pap with HR HPV genotyping performed.  Uterus is felt to be normal size, shape, and contour.  No adnexal masses or tenderness noted.Bladder is non tender, no masses felt. Rectal: Good sphincter tone, no polyps, or hemorrhoids felt.  Hemoccult negative. Extremities/musculoskeletal:  No swelling or varicosities noted, no clubbing or cyanosis Psych:  No mood changes, alert and cooperative,seems happy AA is 0 Fall risk is low    03/24/2023    8:35 AM  04/07/2020   10:29 AM 02/25/2020    8:30 AM  Depression screen PHQ 2/9  Decreased Interest 1 0 0  Down, Depressed, Hopeless 3 2 0  PHQ - 2 Score 4 2 0  Altered sleeping 1 2   Tired, decreased energy 1 3   Change in appetite 3 2   Feeling bad or failure about yourself  0 2   Trouble concentrating 0 0   Moving slowly or fidgety/restless 3 0   Suicidal thoughts 0 0   PHQ-9 Score 12 11    She is on meds     03/24/2023    8:35 AM 04/07/2020   10:30 AM  GAD 7 : Generalized Anxiety Score  Nervous, Anxious, on Edge 3 3  Control/stop worrying 0 2  Worry too much - different things 1 3  Trouble relaxing 3 3  Restless 1 3  Easily annoyed or irritable 0 2  Afraid - awful might happen 0 0  Total GAD 7 Score 8 16      Upstream - 03/24/23 0843       Pregnancy Intention Screening   Does the patient want to become pregnant in the next year? No    Does the patient's partner want to become pregnant in the next year? No    Would the patient like to discuss contraceptive options today? No      Contraception Wrap Up   Current Method Abstinence;No Method - Other Reason    Reason for No Current Contraceptive Method at Intake ([facility] Only) Other   postmenopause  End Method Abstinence;No Method - Other Reason   postmenopause   Contraception Counseling Provided No            Examination chaperoned by Tish RN   Impression and Plan: 1. Encounter for routine gynecological examination with Papanicolaou smear of cervix Pap sent Pap in 3 years if normal Physical next year with PCP Labs with PCP Mammogram was negative 06/11/22 She needs colonoscopy, talk with PCP  2. Encounter for screening fecal occult blood testing Hemoccult was negative  - POCT occult blood stool  3. Hypertension, unspecified type Watch salt and sugars Review DASH diet  She is not on meds Follow up with PCP for management   4. Constipation, unspecified constipation type Has hard BM at times, goes 2-3 x weekly Try  senokot OTC

## 2023-03-24 NOTE — Addendum Note (Signed)
Addended by: Moss Mc on: 03/24/2023 09:24 AM   Modules accepted: Orders

## 2023-03-29 LAB — CYTOLOGY - PAP
Adequacy: ABSENT
Chlamydia: NEGATIVE
Comment: NEGATIVE
Comment: NEGATIVE
Comment: NEGATIVE
Comment: NORMAL
Diagnosis: NEGATIVE
High risk HPV: NEGATIVE
Neisseria Gonorrhea: NEGATIVE
Trichomonas: POSITIVE — AB

## 2023-03-30 ENCOUNTER — Other Ambulatory Visit: Payer: Self-pay | Admitting: Adult Health

## 2023-03-30 ENCOUNTER — Encounter: Payer: Self-pay | Admitting: Adult Health

## 2023-03-30 ENCOUNTER — Telehealth: Payer: Self-pay | Admitting: *Deleted

## 2023-03-30 DIAGNOSIS — A599 Trichomoniasis, unspecified: Secondary | ICD-10-CM | POA: Insufficient documentation

## 2023-03-30 MED ORDER — METRONIDAZOLE 500 MG PO TABS: 500.0000 mg | ORAL_TABLET | Freq: Two times a day (BID) | ORAL | 0 refills | Status: DC

## 2023-03-30 NOTE — Telephone Encounter (Signed)
-----   Message from Adline Potter, NP sent at 03/30/2023 10:04 AM EDT ----- Can you call pt and let her know about trich and pap and that flagyl sent in, no sex or alcohol while taking and if has partner, he needs to be treated(I can treat if need be) and get PCO in about 2 weeks, can be self swab. THX

## 2023-03-30 NOTE — Telephone Encounter (Signed)
Pt aware pap was normal except + for trich. Flagyl was sent in. No sex or alcohol while taking med. Partner needs to be treated. Pt states he is in [place]. Needs POC in 2 weeks. Pt states she will call back to schedule POC. Pt voiced understanding. JSY

## 2023-03-30 NOTE — Progress Notes (Signed)
+  trich on pap will rx flagyl, no sex or alcohol while taking, will get POC in about 2 weeks

## 2023-05-06 ENCOUNTER — Emergency Department (HOSPITAL_COMMUNITY): Payer: Medicare HMO

## 2023-05-06 ENCOUNTER — Other Ambulatory Visit: Payer: Self-pay

## 2023-05-06 ENCOUNTER — Encounter (HOSPITAL_COMMUNITY): Payer: Self-pay | Admitting: *Deleted

## 2023-05-06 ENCOUNTER — Emergency Department (HOSPITAL_COMMUNITY)
Admission: EM | Admit: 2023-05-06 | Discharge: 2023-05-06 | Disposition: A | Discharge status: Home / Self Care | Payer: Medicare HMO | Attending: Emergency Medicine | Admitting: Emergency Medicine

## 2023-05-06 DIAGNOSIS — I10 Essential (primary) hypertension: Secondary | ICD-10-CM | POA: Insufficient documentation

## 2023-05-06 DIAGNOSIS — M25552 Pain in left hip: Secondary | ICD-10-CM | POA: Diagnosis not present

## 2023-05-06 LAB — PREGNANCY, URINE: Preg Test, Ur: NEGATIVE

## 2023-05-06 MED ORDER — KETOROLAC TROMETHAMINE 15 MG/ML IJ SOLN
15.0000 mg | Freq: Once | INTRAMUSCULAR | Status: AC
  Administered 2023-05-06: 15 mg via INTRAMUSCULAR
  Filled 2023-05-06: qty 1

## 2023-05-06 NOTE — ED Notes (Signed)
ED Provider at bedside. 

## 2023-05-06 NOTE — Discharge Instructions (Signed)
You have been seen today for your complaint of left hip pain. Your imaging showed arthritis but was otherwise reassuring. Your discharge medications include tylenol. You may take up to 1000 mg every 6 hours for pain. Follow up with: Dr. Blanchie Dessert.  He is an Investment banker, operational.  Call to schedule an appointment for follow-up visit. Please seek immediate medical care if you develop any of the following symptoms: You cannot move the joint. Your fingers or toes tingle, become numb, or turn cold and blue. You have a fever along with a joint that is red, warm, and swollen. At this time there does not appear to be the presence of an emergent medical condition, however there is always the potential for conditions to change. Please read and follow the below instructions.  Do not take your medicine if  develop an itchy rash, swelling in your mouth or lips, or difficulty breathing; call 911 and seek immediate emergency medical attention if this occurs.  You may review your lab tests and imaging results in their entirety on your MyChart account.  Please discuss all results of fully with your primary care provider and other specialist at your follow-up visit.  Note: Portions of this text may have been transcribed using voice recognition software. Every effort was made to ensure accuracy; however, inadvertent computerized transcription errors may still be present.

## 2023-05-06 NOTE — ED Triage Notes (Signed)
Pt with left hip pain for a week.  Pt states hip feels like "it's slipping"

## 2023-05-06 NOTE — ED Notes (Signed)
X-ray at bedside

## 2023-05-06 NOTE — ED Provider Notes (Signed)
[place] EMERGENCY DEPARTMENT AT [facility] Provider Note   CSN: [id number] Arrival date & time: 05/06/23  1212     History  Chief Complaint  Patient presents with   Hip Pain    Denise HOEFER is a 47 y.o. female.  With a history of hypertension, anxiety, depression, schizophrenia, and bipolar 1 disorder, arthritis who presents to the ED for evaluation of left hip pain.  Symptoms began approximately 1 week ago.  She denies any preceding falls, trauma or other injuries.  Her pain is intermittent.  It is occasionally worse with walking.  She denies any fevers or chills.  No numbness, weakness or tingling.  No lower leg or back pain.  She has not taken anything for pain prior to arrival.     Hip Pain       Home Medications Prior to Admission medications   Medication Sig Start Date End Date Taking? Authorizing Provider  metroNIDAZOLE (FLAGYL) 500 MG tablet Take 1 tablet (500 mg total) by mouth 2 (two) times daily. 03/30/23   Adline Potter, NP  albuterol (PROVENTIL HFA;VENTOLIN HFA) 108 (90 BASE) MCG/ACT inhaler Inhale 1-2 puffs into the lungs every 6 (six) hours as needed for wheezing or shortness of breath. 03/13/15   Zadie Rhine, MD  benztropine (COGENTIN) 1 MG tablet Take 1 mg by mouth 2 (two) times daily. 02/24/20   [provider]  benztropine (COGENTIN) 1 MG tablet Take 1 tablet by mouth 2 (two) times daily. Patient not taking: Reported on 03/24/2023    [provider]  cholecalciferol (VITAMIN D3) 25 MCG (1000 UNIT) tablet Take 1,000 Units by mouth daily.    [provider]  citalopram (CELEXA) 20 MG tablet Take 20 mg by mouth daily. Patient not taking: Reported on 03/24/2023 02/27/22   [provider]  citalopram (CELEXA) 20 MG tablet Take by mouth. Patient not taking: Reported on 03/24/2023 10/28/22   [provider]  haloperidol (HALDOL) 5 MG tablet Take 5 mg by mouth 2 (two) times daily. Patient not taking:  Reported on 03/24/2023 02/24/20   [provider]  haloperidol (HALDOL) 5 MG tablet Take by mouth. 10/28/22   [provider]  hydrOXYzine (ATARAX/VISTARIL) 50 MG tablet Take one (1) tablet by mouth three times a day 05/30/19   [provider]  ibuprofen (ADVIL) 200 MG tablet Take 200 mg by mouth as needed. Takes 2 tablets as needed.    [provider]  meclizine (ANTIVERT) 25 MG tablet Take 1 tablet (25 mg total) by mouth 3 (three) times daily as needed for dizziness. 07/29/22   Clark, Meghan R, PA-C  meloxicam (MOBIC) 7.5 MG tablet Take 7.5 mg by mouth 2 (two) times daily as needed. 02/15/22   [provider]  methocarbamol (ROBAXIN) 500 MG tablet  05/26/22   [provider]  nystatin (MYCOSTATIN/NYSTOP) powder APPLY POWDER TOPICALLY TO AFFECTED AREA TWICE DAILY AS NEEDED FOR SKIN RASH OR IRRITAION    [provider]  traZODone (DESYREL) 100 MG tablet Take 200 mg by mouth at bedtime as needed. 02/24/20   [provider]  zinc oxide (MEIJER ZINC OXIDE) 20 % ointment Apply 1 Application topically as needed for irritation. 02/12/23   Jeanelle Malling, PA      Allergies    Acetaminophen, Banana, Egg-derived products, and Tomato    Review of Systems   Review of Systems  Musculoskeletal:  Positive for arthralgias.  All other systems reviewed and are negative.  Physical Exam Updated Vital Signs BP 130/70 (BP Location: Right Arm)   Pulse 63   Temp 97.6 F (36.4 C) (Temporal)   Resp 18   Ht 5\' 2"  (1.575 m)   Wt 117.9 kg   SpO2 93%   BMI 47.55 kg/m  Physical Exam Vitals and nursing note reviewed.  Constitutional:      General: She is not in acute distress.    Appearance: Normal appearance. She is normal weight. She is not ill-appearing.  HENT:     Head: Normocephalic and atraumatic.  Pulmonary:     Effort: Pulmonary effort is normal. No respiratory distress.  Abdominal:     General: Abdomen is flat.  Musculoskeletal:         General: Normal range of motion.     Cervical back: Neck supple.     Comments: Full AROM in hip flexion and extension.  Pain with internal logroll, passive hip flexion past approximately 75 degrees.  No lower extremity swelling on the left when compared contralaterally.  No overlying skin changes.  Posterior tibial pulses 1+ bilaterally  Skin:    General: Skin is warm and dry.  Neurological:     Mental Status: She is alert and oriented to person, place, and time.  Psychiatric:        Mood and Affect: Mood normal.        Behavior: Behavior normal.     ED Results / Procedures / Treatments   Labs (all labs ordered are listed, but only abnormal results are displayed) Labs Reviewed  PREGNANCY, URINE    EKG None  Radiology DG Hip Unilat With Pelvis 2-3 Views Left  Result Date: 05/06/2023 CLINICAL DATA:  Left hip pain. EXAM: DG HIP (WITH OR WITHOUT PELVIS) 2-3V LEFT COMPARISON:  None Available. FINDINGS: There is no evidence of hip fracture or dislocation. Mild osteoarthritic changes of the left hip with subchondral sclerosis and small osteophyte formation. IMPRESSION: Mild osteoarthritic changes of the left hip. Electronically Signed   By: Ted Mcalpine M.D.   On: 05/06/2023 14:25    Procedures Procedures    Medications Ordered in ED Medications  ketorolac (TORADOL) 15 MG/ML injection 15 mg (15 mg Intramuscular Given 05/06/23 1351)    ED Course/ Medical Decision Making/ A&P                             Medical Decision Making Amount and/or Complexity of Data Reviewed Labs: ordered. Radiology: ordered.  Risk Prescription drug management.  This patient presents to the ED for concern of left hip pain, this involves an extensive number of treatment options, and is a complaint that carries with it a high risk of complications and morbidity.  The differential diagnosis includes fracture, strain, sprain, contusion, OA/RA, septic arthritis  Co morbidities that complicate the  patient evaluation  hypertension, anxiety, depression, schizophrenia, and bipolar 1 disorder, arthritis  My initial workup includes urine pregnancy, imaging, pain control  Additional history obtained from: Nursing notes from this visit.  I ordered, reviewed and interpreted labs which include: UPT.  Negative.  I ordered imaging studies including x-ray left I independently visualized and interpreted imaging which showed arthritic changes.  Otherwise normal I agree with the radiologist interpretation  Afebrile, hemodynamically stable.  47 year old female presents to the ED for evaluation of atraumatic left hip pain.  She has no signs or symptoms of an infection.  Her pain is reproducible with specific motions.  Specifically internal  rotation and flexion past 75 degrees.  Her neurovascular status is intact.  Imaging showed arthritic changes but was otherwise negative for acute osseous abnormalities.  My clinical suspicion for septic arthritis is low.  Overall I do suspect osteoarthritis as the cause of her symptoms.  She reported significant improvement in her pain after Tylenol in the ED.  She was encouraged to use Tylenol at home for the pain.  She was given contact information for orthopedics and encouraged to follow-up.  She was given return precautions.  Stable at discharge.  At this time there does not appear to be any evidence of an acute emergency medical condition and the patient appears stable for discharge with appropriate outpatient follow up. Diagnosis was discussed with patient who verbalizes understanding of care plan and is agreeable to discharge. I have discussed return precautions with patient who verbalizes understanding. Patient encouraged to follow-up with orthopedics within 1 week. All questions answered.  Note: Portions of this report may have been transcribed using voice recognition software. Every effort was made to ensure accuracy; however, inadvertent computerized  transcription errors may still be present.        Final Clinical Impression(s) / ED Diagnoses Final diagnoses:  Left hip pain    Rx / DC Orders ED Discharge Orders     None         Michelle Piper, Cordelia Poche 05/06/23 1450    Rondel Baton, MD 05/11/23 1259

## 2023-05-16 ENCOUNTER — Other Ambulatory Visit (HOSPITAL_COMMUNITY): Payer: Self-pay | Admitting: Adult Health

## 2023-05-16 DIAGNOSIS — Z1231 Encounter for screening mammogram for malignant neoplasm of breast: Secondary | ICD-10-CM

## 2023-06-07 ENCOUNTER — Emergency Department (HOSPITAL_COMMUNITY): Payer: Medicare HMO

## 2023-06-07 ENCOUNTER — Encounter (HOSPITAL_COMMUNITY): Payer: Self-pay | Admitting: Pharmacy Technician

## 2023-06-07 ENCOUNTER — Other Ambulatory Visit: Payer: Self-pay

## 2023-06-07 ENCOUNTER — Emergency Department (HOSPITAL_COMMUNITY)
Admission: EM | Admit: 2023-06-07 | Discharge: 2023-06-07 | Disposition: A | Discharge status: Home / Self Care | Payer: Medicare HMO | Attending: Emergency Medicine | Admitting: Emergency Medicine

## 2023-06-07 DIAGNOSIS — R7303 Prediabetes: Secondary | ICD-10-CM | POA: Diagnosis not present

## 2023-06-07 DIAGNOSIS — I10 Essential (primary) hypertension: Secondary | ICD-10-CM | POA: Diagnosis not present

## 2023-06-07 DIAGNOSIS — Z1152 Encounter for screening for COVID-19: Secondary | ICD-10-CM | POA: Diagnosis not present

## 2023-06-07 DIAGNOSIS — R3589 Other polyuria: Secondary | ICD-10-CM | POA: Diagnosis present

## 2023-06-07 DIAGNOSIS — J40 Bronchitis, not specified as acute or chronic: Secondary | ICD-10-CM | POA: Diagnosis not present

## 2023-06-07 DIAGNOSIS — R590 Localized enlarged lymph nodes: Secondary | ICD-10-CM | POA: Diagnosis not present

## 2023-06-07 LAB — URINALYSIS, W/ REFLEX TO CULTURE (INFECTION SUSPECTED)
Bacteria, UA: NONE SEEN
Bilirubin Urine: NEGATIVE
Glucose, UA: NEGATIVE mg/dL
Hgb urine dipstick: NEGATIVE
Ketones, ur: NEGATIVE mg/dL
Leukocytes,Ua: NEGATIVE
Nitrite: NEGATIVE
Protein, ur: NEGATIVE mg/dL
Specific Gravity, Urine: 1.013 (ref 1.005–1.030)
pH: 5 (ref 5.0–8.0)

## 2023-06-07 LAB — COMPREHENSIVE METABOLIC PANEL
ALT: 10 U/L (ref 0–44)
AST: 16 U/L (ref 15–41)
Albumin: 3.2 g/dL — ABNORMAL LOW (ref 3.5–5.0)
Alkaline Phosphatase: 45 U/L (ref 38–126)
Anion gap: 6 (ref 5–15)
BUN: 6 mg/dL (ref 6–20)
CO2: 29 mmol/L (ref 22–32)
Calcium: 8.6 mg/dL — ABNORMAL LOW (ref 8.9–10.3)
Chloride: 100 mmol/L (ref 98–111)
Creatinine, Ser: 0.86 mg/dL (ref 0.44–1.00)
GFR, Estimated: >60 mL/min (ref >60)
Glucose, Bld: 125 mg/dL — ABNORMAL HIGH (ref 70–99)
Potassium: 3.8 mmol/L (ref 3.5–5.1)
Sodium: 135 mmol/L (ref 135–145)
Total Bilirubin: 0.7 mg/dL (ref 0.3–1.2)
Total Protein: 6.2 g/dL — ABNORMAL LOW (ref 6.5–8.1)

## 2023-06-07 LAB — PREGNANCY, URINE: Preg Test, Ur: NEGATIVE

## 2023-06-07 LAB — CBC WITH DIFFERENTIAL/PLATELET
Abs Immature Granulocytes: 0.03 10*3/uL (ref 0.00–0.07)
Basophils Absolute: 0 10*3/uL (ref 0.0–0.1)
Basophils Relative: 0 %
Eosinophils Absolute: 0.2 10*3/uL (ref 0.0–0.5)
Eosinophils Relative: 2 %
HCT: 43.7 % (ref 36.0–46.0)
Hemoglobin: 14.7 g/dL (ref 12.0–15.0)
Immature Granulocytes: 0 %
Lymphocytes Relative: 32 %
Lymphs Abs: 2.4 10*3/uL (ref 0.7–4.0)
MCH: 32.8 pg (ref 26.0–34.0)
MCHC: 33.6 g/dL (ref 30.0–36.0)
MCV: 97.5 fL (ref 80.0–100.0)
Monocytes Absolute: 0.4 10*3/uL (ref 0.1–1.0)
Monocytes Relative: 5 %
Neutro Abs: 4.5 10*3/uL (ref 1.7–7.7)
Neutrophils Relative %: 61 %
Platelets: 193 10*3/uL (ref 150–400)
RBC: 4.48 MIL/uL (ref 3.87–5.11)
RDW: 15.5 % (ref 11.5–15.5)
WBC: 7.5 10*3/uL (ref 4.0–10.5)
nRBC: 0 % (ref 0.0–0.2)

## 2023-06-07 LAB — RESP PANEL BY RT-PCR (RSV, FLU A&B, COVID)  RVPGX2
Influenza A by PCR: NEGATIVE
Influenza B by PCR: NEGATIVE
Resp Syncytial Virus by PCR: NEGATIVE
SARS Coronavirus 2 by RT PCR: NEGATIVE

## 2023-06-07 LAB — CBG MONITORING, ED: Glucose-Capillary: 153 mg/dL — ABNORMAL HIGH (ref 70–99)

## 2023-06-07 LAB — MAGNESIUM: Magnesium: 1.6 mg/dL — ABNORMAL LOW (ref 1.7–2.4)

## 2023-06-07 MED ORDER — IOHEXOL 350 MG/ML SOLN
75.0000 mL | Freq: Once | INTRAVENOUS | Status: AC | PRN
  Administered 2023-06-07: 75 mL via INTRAVENOUS

## 2023-06-07 MED ORDER — PREDNISONE 50 MG PO TABS
60.0000 mg | ORAL_TABLET | Freq: Once | ORAL | Status: AC
  Administered 2023-06-07: 60 mg via ORAL
  Filled 2023-06-07: qty 1

## 2023-06-07 MED ORDER — LACTATED RINGERS IV BOLUS (SEPSIS): 500.0000 mL | Freq: Once | INTRAVENOUS | Status: DC

## 2023-06-07 MED ORDER — ALBUTEROL SULFATE (2.5 MG/3ML) 0.083% IN NEBU
2.5000 mg | INHALATION_SOLUTION | Freq: Once | RESPIRATORY_TRACT | Status: AC
  Administered 2023-06-07: 2.5 mg via RESPIRATORY_TRACT
  Filled 2023-06-07: qty 3

## 2023-06-07 MED ORDER — MAGNESIUM SULFATE 2 GM/50ML IV SOLN
2.0000 g | Freq: Once | INTRAVENOUS | Status: AC
  Administered 2023-06-07: 2 g via INTRAVENOUS
  Filled 2023-06-07: qty 50

## 2023-06-07 MED ORDER — AZITHROMYCIN 250 MG PO TABS: 250.0000 mg | ORAL_TABLET | Freq: Every day | ORAL | 0 refills | Status: DC

## 2023-06-07 MED ORDER — ALBUTEROL SULFATE HFA 108 (90 BASE) MCG/ACT IN AERS: 1.0000 | INHALATION_SPRAY | Freq: Four times a day (QID) | RESPIRATORY_TRACT | 2 refills | Status: AC | PRN

## 2023-06-07 MED ORDER — PREDNISONE 10 MG PO TABS: 40.0000 mg | ORAL_TABLET | Freq: Every day | ORAL | 0 refills | Status: AC

## 2023-06-07 MED ORDER — ALBUTEROL SULFATE HFA 108 (90 BASE) MCG/ACT IN AERS
1.0000 | INHALATION_SPRAY | Freq: Once | RESPIRATORY_TRACT | Status: AC
  Administered 2023-06-07: 1 via RESPIRATORY_TRACT
  Filled 2023-06-07: qty 6.7

## 2023-06-07 NOTE — ED Notes (Signed)
Pt stayed at 97% 02 on room air while ambulating with pulse ox.

## 2023-06-07 NOTE — ED Notes (Addendum)
Pt placed on 2 L [place] for SpO2 dropping to 88% RA--MD made aware

## 2023-06-07 NOTE — ED Provider Notes (Addendum)
[place] EMERGENCY DEPARTMENT AT [facility] Provider Note   CSN: [id number] Arrival date & time: 06/07/23  0840     History  Chief Complaint  Patient presents with   Polyuria    [name] is a 47 y.o. female.  HPI Patient presents for cough, fatigue, chills, generalized weakness, and polyuria.  Medical history includes asthma, schizophrenia, HTN, HLD, arthritis, anxiety.  She was last seen in the ED 1 month ago for left hip pain.  2 days ago, she was in her normal state of health.  Yesterday morning, she woke up with current symptoms.  She states that she has a history of borderline diabetes.  She was previously on metformin but was taken off due to normal blood sugars.  She does not check her blood sugar at home.  She denies any abdominal pain or back pain.    Home Medications Prior to Admission medications   Medication Sig Start Date End Date Taking? Authorizing Provider  azithromycin (ZITHROMAX) 250 MG tablet Take 1 tablet (250 mg total) by mouth daily. Take first 2 tablets together, then 1 every day until finished. 06/07/23  Yes Gloris Manchester, MD  metroNIDAZOLE (FLAGYL) 500 MG tablet Take 1 tablet (500 mg total) by mouth 2 (two) times daily. 03/30/23   Adline Potter, NP  predniSONE (DELTASONE) 10 MG tablet Take 4 tablets (40 mg total) by mouth daily for 4 days. 06/07/23 06/11/23 Yes Gloris Manchester, MD  albuterol (VENTOLIN HFA) 108 (90 Base) MCG/ACT inhaler Inhale 1-2 puffs into the lungs every 6 (six) hours as needed for wheezing or shortness of breath. 06/07/23   Gloris Manchester, MD  benztropine (COGENTIN) 1 MG tablet Take 1 mg by mouth 2 (two) times daily. 02/24/20   [provider]  benztropine (COGENTIN) 1 MG tablet Take 1 tablet by mouth 2 (two) times daily. Patient not taking: Reported on 03/24/2023    [provider]  cholecalciferol (VITAMIN D3) 25 MCG (1000 UNIT) tablet Take 1,000 Units by mouth daily.    [provider]  citalopram  (CELEXA) 20 MG tablet Take 20 mg by mouth daily. Patient not taking: Reported on 03/24/2023 02/27/22   [provider]  citalopram (CELEXA) 20 MG tablet Take by mouth. Patient not taking: Reported on 03/24/2023 10/28/22   [provider]  haloperidol (HALDOL) 5 MG tablet Take 5 mg by mouth 2 (two) times daily. Patient not taking: Reported on 03/24/2023 02/24/20   [provider]  haloperidol (HALDOL) 5 MG tablet Take by mouth. 10/28/22   [provider]  hydrOXYzine (ATARAX/VISTARIL) 50 MG tablet Take one (1) tablet by mouth three times a day 05/30/19   [provider]  ibuprofen (ADVIL) 200 MG tablet Take 200 mg by mouth as needed. Takes 2 tablets as needed.    [provider]  meclizine (ANTIVERT) 25 MG tablet Take 1 tablet (25 mg total) by mouth 3 (three) times daily as needed for dizziness. 07/29/22   Clark, Meghan R, PA-C  meloxicam (MOBIC) 7.5 MG tablet Take 7.5 mg by mouth 2 (two) times daily as needed. 02/15/22   [provider]  methocarbamol (ROBAXIN) 500 MG tablet  05/26/22   [provider]  nystatin (MYCOSTATIN/NYSTOP) powder APPLY POWDER TOPICALLY TO AFFECTED AREA TWICE DAILY AS NEEDED FOR SKIN RASH OR IRRITAION    [provider]  traZODone (DESYREL) 100 MG tablet Take 200 mg by mouth at bedtime as needed. 02/24/20   [provider]  zinc  oxide (MEIJER ZINC OXIDE) 20 % ointment Apply 1 Application topically as needed for irritation. 02/12/23   Jeanelle Malling, PA      Allergies    Acetaminophen, Banana, Egg-derived products, and Tomato    Review of Systems   Review of Systems  Constitutional:  Positive for chills and fatigue.  Respiratory:  Positive for cough.   Endocrine: Positive for polyuria.  Neurological:  Positive for weakness (Generalized).  All other systems reviewed and are negative.   Physical Exam Updated Vital Signs BP 120/68   Pulse 87   Temp 98.2 F (36.8 C) (Oral)   Resp 14   SpO2 100%   Physical Exam Vitals and nursing note reviewed.  Constitutional:      General: She is not in acute distress.    Appearance: Normal appearance. She is well-developed. She is not ill-appearing, toxic-appearing or diaphoretic.  HENT:     Head: Normocephalic and atraumatic.     Right Ear: External ear normal.     Left Ear: External ear normal.     Nose: Nose normal.     Mouth/Throat:     Mouth: Mucous membranes are moist.  Eyes:     Extraocular Movements: Extraocular movements intact.     Conjunctiva/sclera: Conjunctivae normal.  Cardiovascular:     Rate and Rhythm: Normal rate and regular rhythm.     Heart sounds: No murmur heard. Pulmonary:     Effort: Pulmonary effort is normal. No respiratory distress.     Breath sounds: Normal breath sounds. No wheezing or rales.  Chest:     Chest wall: No tenderness.  Abdominal:     General: There is no distension.     Palpations: Abdomen is soft.     Tenderness: There is no abdominal tenderness. There is no right CVA tenderness or left CVA tenderness.  Musculoskeletal:        General: No swelling. Normal range of motion.     Cervical back: Normal range of motion and neck supple.     Right lower leg: No edema.     Left lower leg: No edema.  Skin:    General: Skin is warm and dry.     Coloration: Skin is not jaundiced or pale.  Neurological:     General: No focal deficit present.     Mental Status: She is alert and oriented to person, place, and time.     Cranial Nerves: No cranial nerve deficit.     Sensory: No sensory deficit.     Motor: No weakness.     Coordination: Coordination normal.  Psychiatric:        Mood and Affect: Mood normal.        Behavior: Behavior normal.     ED Results / Procedures / Treatments   Labs (all labs ordered are listed, but only abnormal results are displayed) Labs Reviewed  COMPREHENSIVE METABOLIC PANEL - Abnormal; Notable for the following components:      Result Value   Glucose, Bld 125 (*)     Calcium 8.6 (*)    Total Protein 6.2 (*)    Albumin 3.2 (*)    All other components within normal limits  URINALYSIS, W/ REFLEX TO CULTURE (INFECTION SUSPECTED) - Abnormal; Notable for the following components:   APPearance HAZY (*)    All other components within normal limits  MAGNESIUM - Abnormal; Notable for the following components:   Magnesium 1.6 (*)    All other components within normal limits  CBG  MONITORING, ED - Abnormal; Notable for the following components:   Glucose-Capillary 153 (*)    All other components within normal limits  RESP PANEL BY RT-PCR (RSV, FLU A&B, COVID)  [id number]  CBC WITH DIFFERENTIAL/PLATELET  PREGNANCY, URINE    EKG EKG Interpretation Date/Time:  Tuesday June 07 2023 09:12:36 EDT Ventricular Rate:  71 PR Interval:  128 QRS Duration:  87 QT Interval:  407 QTC Calculation: 443 R Axis:   34  Text Interpretation: Sinus rhythm Probable left atrial enlargement Low voltage, precordial leads Confirmed by Gloris Manchester ([id number]) on 06/07/2023 9:24:48 AM  Radiology CT Angio Chest PE W and/or Wo Contrast  Result Date: 06/07/2023 CLINICAL DATA:  Fatigue and chills EXAM: CT ANGIOGRAPHY CHEST WITH CONTRAST TECHNIQUE: Multidetector CT imaging of the chest was performed using the standard protocol during bolus administration of intravenous contrast. Multiplanar CT image reconstructions and MIPs were obtained to evaluate the vascular anatomy. RADIATION DOSE REDUCTION: This exam was performed according to the departmental dose-optimization program which includes automated exposure control, adjustment of the mA and/or kV according to patient size and/or use of iterative reconstruction technique. CONTRAST:  75mL OMNIPAQUE IOHEXOL 350 MG/ML SOLN COMPARISON:  None Available. FINDINGS: Cardiovascular: No evidence of pulmonary embolus. Heart is upper limits of normal in size. No pericardial effusion. Normal caliber thoracic aorta with no atherosclerotic disease. No visible  coronary artery calcifications. Mediastinum/Nodes: Esophagus and thyroid are unremarkable. Mildly enlarged mediastinal lymph nodes, likely reactive. Reference subcarinal lymph node measures 12 mm in short axis on series 4, image 31. Lungs/Pleura: Central airways are patent. Mild bilateral bronchial wall thickening. Mild mosaic attenuation is likely due to air trapping. No consolidation, pleural effusion or pneumothorax. Upper Abdomen: No acute abnormality. Musculoskeletal: No chest wall abnormality. No acute or significant osseous findings. Review of the MIP images confirms the above findings. IMPRESSION: 1. No evidence of pulmonary embolus. 2. Mild bilateral bronchial wall thickening, finding can be seen in the setting of bronchitis. 3. Mild mosaic attenuation is likely due to air trapping, can be seen in the setting of small airways disease. 4. Mildly enlarged mediastinal lymph nodes, likely reactive. Electronically Signed   By: Allegra Lai M.D.   On: 06/07/2023 13:33   DG Chest Port 1 View  Result Date: 06/07/2023 CLINICAL DATA:  Sepsis and shortness of breath. EXAM: PORTABLE CHEST 1 VIEW COMPARISON:  12/07/2021 FINDINGS: No consolidation, pneumothorax or effusion. No edema. Underinflation. Normal cardiopericardial silhouette. Degenerative changes seen of the shoulders. IMPRESSION: Underinflation.  No acute cardiopulmonary disease. Electronically Signed   By: Karen Kays M.D.   On: 06/07/2023 10:39    Procedures Procedures    Medications Ordered in ED Medications  lactated ringers bolus 500 mL (500 mLs Intravenous Bolus 06/07/23 0926)  magnesium sulfate IVPB 2 g 50 mL (0 g Intravenous Stopped 06/07/23 1135)  iohexol (OMNIPAQUE) 350 MG/ML injection 75 mL (75 mLs Intravenous Contrast Given 06/07/23 1218)  predniSONE (DELTASONE) tablet 60 mg (60 mg Oral Given 06/07/23 1511)  albuterol (PROVENTIL) (2.5 MG/3ML) 0.083% nebulizer solution 2.5 mg (2.5 mg Nebulization Given 06/07/23 1526)  albuterol  (VENTOLIN HFA) 108 (90 Base) MCG/ACT inhaler 1 puff (1 puff Inhalation Given 06/07/23 1536)    ED Course/ Medical Decision Making/ A&P                                 Medical Decision Making Amount and/or Complexity of Data Reviewed Labs: ordered. Radiology: ordered. ECG/medicine  tests: ordered.  Risk Prescription drug management.   This patient presents to the ED for concern of fatigue, this involves an extensive number of treatment options, and is a complaint that carries with it a high risk of complications and morbidity.  The differential diagnosis includes infection, dehydration, polypharmacy, metabolic derangements   Co morbidities that complicate the patient evaluation  asthma, schizophrenia, HTN, HLD, arthritis, anxiety   Additional history obtained:  Additional history obtained from N/A External records from outside source obtained and reviewed including EMR   Lab Tests:  I Ordered, and personally interpreted labs.  The pertinent results include: Normal hemoglobin, no leukocytosis, normal glucose, normal kidney function.  Hypomagnesemia is present with otherwise normal electrolytes.  No evidence of UTI.   Imaging Studies ordered:  I ordered imaging studies including chest x-ray, CTA chest I independently visualized and interpreted imaging which showed marked wall thickening suggestive of bronchitis, mild mediastinal lymphadenopathy I agree with the radiologist interpretation   Cardiac Monitoring: / EKG:  The patient was maintained on a cardiac monitor.  I personally viewed and interpreted the cardiac monitored which showed an underlying rhythm of: Sinus rhythm   Problem List / ED Course / Critical interventions / Medication management  Patient presents for generalized weakness, fatigue, and chills.  She states that she has been urinating frequently, including all night last night.  She denies any current or recent areas of pain.  On exam, she is  well-appearing.  Abdomen is soft and nontender.  No CVA tenderness is present.  CBG was in the range of 150.  Vital signs are notable for tachypnea.  Workup was initiated.  Lab work shows hypomagnesemia.  Replacement magnesium was ordered.  While in the ED, patient had decreased SpO2, in range of 90% on room air.  A CTA of chest was ordered which did not show any evidence of PE.  It did show findings consistent with reactive airway disease and bronchitis.  She does have a history of asthma.  Patient was given prednisone and albuterol.  After that, she was able to ambulate without difficulty.  SpO2 remained in the high 90s.  She was discharged in stable condition. I ordered medication including IV fluids for hydration; magnesium sulfate for hypomagnesemia; prednisone and albuterol for asthma and bronchitis Reevaluation of the patient after these medicines showed that the patient improved I have reviewed the patients home medicines and have made adjustments as needed   Social Determinants of Health:  Has PCP        Final Clinical Impression(s) / ED Diagnoses Final diagnoses:  Bronchitis    Rx / DC Orders ED Discharge Orders          Ordered    predniSONE (DELTASONE) 10 MG tablet  Daily        06/07/23 1515    albuterol (VENTOLIN HFA) 108 (90 Base) MCG/ACT inhaler  Every 6 hours PRN        06/07/23 1515    azithromycin (ZITHROMAX) 250 MG tablet  Daily        06/07/23 1515              Gloris Manchester, MD 06/07/23 1534    Gloris Manchester, MD 06/07/23 1554

## 2023-06-07 NOTE — Discharge Instructions (Signed)
Prescriptions for steroids and antibiotics were sent to your pharmacy.  Take as prescribed for the next 5 days.  Use your inhaler at home as needed for cough and shortness of breath.  Return to the emergency department for any new or worsening symptoms of concern.

## 2023-06-07 NOTE — ED Triage Notes (Signed)
Pt here via pov with reports of polyuria onset last night. Pt also endorses generalized fatigue and some chills. Pt with hx DM, has  not checked her blood sugar recently.

## 2023-06-15 ENCOUNTER — Encounter (HOSPITAL_COMMUNITY): Payer: Self-pay

## 2023-06-15 ENCOUNTER — Ambulatory Visit (HOSPITAL_COMMUNITY)
Admission: RE | Admit: 2023-06-15 | Discharge: 2023-06-15 | Disposition: A | Discharge status: Home / Self Care | Payer: Medicare HMO | Source: Ambulatory Visit | Attending: Adult Health | Admitting: Adult Health

## 2023-06-15 DIAGNOSIS — Z1231 Encounter for screening mammogram for malignant neoplasm of breast: Secondary | ICD-10-CM | POA: Diagnosis present

## 2023-06-29 ENCOUNTER — Other Ambulatory Visit (HOSPITAL_COMMUNITY): Payer: Self-pay | Admitting: Adult Health

## 2023-06-29 DIAGNOSIS — R928 Other abnormal and inconclusive findings on diagnostic imaging of breast: Secondary | ICD-10-CM

## 2023-07-12 ENCOUNTER — Ambulatory Visit (HOSPITAL_COMMUNITY)
Admission: RE | Admit: 2023-07-12 | Discharge: 2023-07-12 | Disposition: A | Discharge status: Home / Self Care | Payer: Medicare HMO | Source: Ambulatory Visit | Attending: Adult Health | Admitting: Adult Health

## 2023-07-12 DIAGNOSIS — R928 Other abnormal and inconclusive findings on diagnostic imaging of breast: Secondary | ICD-10-CM

## 2023-08-11 ENCOUNTER — Ambulatory Visit: Payer: Medicare PPO | Admitting: Podiatry

## 2023-08-11 ENCOUNTER — Encounter: Payer: Self-pay | Admitting: Podiatry

## 2023-08-11 DIAGNOSIS — M79674 Pain in right toe(s): Secondary | ICD-10-CM

## 2023-08-11 DIAGNOSIS — G6289 Other specified polyneuropathies: Secondary | ICD-10-CM

## 2023-08-11 DIAGNOSIS — B351 Tinea unguium: Secondary | ICD-10-CM

## 2023-08-11 DIAGNOSIS — M79675 Pain in left toe(s): Secondary | ICD-10-CM | POA: Diagnosis not present

## 2023-08-11 NOTE — Progress Notes (Signed)
  Subjective:  Patient ID: [name], female    DOB: 09-21-1976,  MRN: [id number]  Chief Complaint  Patient presents with   Routine Foot Care    Thickened toenails, darkened, difficult for patient to maintain. No DM, no blood thinner    47 y.o. female presents with the above complaint. History confirmed with patient. Patient presenting with pain related to dystrophic thickened elongated nails. Patient is unable to trim own nails related to nail dystrophy and/or mobility issues. Patient does not have a history of T2DM.  Patient does report some numbness in her toes.  Reports she is not diabetic and has had testing done recently to confirm this though no clear A1c level in chart  Objective:  Physical Exam: warm, good capillary refill nail exam onychomycosis of the toenails, onycholysis, and dystrophic nails DP pulses palpable and PT pulses palpable Protective sensation diminished to toes Left Foot:  Pain with palpation of nails due to elongation and dystrophic growth.  Right Foot: Pain with palpation of nails due to elongation and dystrophic growth.   Assessment:   1. Pain due to onychomycosis of toenails of both feet   2. Other polyneuropathy      Plan:  Patient was evaluated and treated and all questions answered.  # Possible mild peripheral neuropathy unclear etiology  -Recommend discussed with primary for possible neuropathic eval for possible diabetes as the cause  #Onychomycosis with pain  -Nails palliatively debrided as below. -Educated on self-care  Procedure: Nail Debridement Rationale: Pain Type of Debridement: manual, sharp debridement. Instrumentation: Nail nipper, rotary burr. Number of Nails: 10  No follow-ups on file.         Corinna Gab, DPM [facility]

## 2023-08-18 DIAGNOSIS — Z9181 History of falling: Secondary | ICD-10-CM | POA: Diagnosis not present

## 2023-08-18 DIAGNOSIS — Z823 Family history of stroke: Secondary | ICD-10-CM | POA: Diagnosis not present

## 2023-08-18 DIAGNOSIS — Z791 Long term (current) use of non-steroidal anti-inflammatories (NSAID): Secondary | ICD-10-CM | POA: Diagnosis not present

## 2023-08-18 DIAGNOSIS — Z833 Family history of diabetes mellitus: Secondary | ICD-10-CM | POA: Diagnosis not present

## 2023-08-18 DIAGNOSIS — I10 Essential (primary) hypertension: Secondary | ICD-10-CM | POA: Diagnosis not present

## 2023-08-18 DIAGNOSIS — F259 Schizoaffective disorder, unspecified: Secondary | ICD-10-CM | POA: Diagnosis not present

## 2023-08-18 DIAGNOSIS — J45909 Unspecified asthma, uncomplicated: Secondary | ICD-10-CM | POA: Diagnosis not present

## 2023-08-18 DIAGNOSIS — K219 Gastro-esophageal reflux disease without esophagitis: Secondary | ICD-10-CM | POA: Diagnosis not present

## 2023-08-18 DIAGNOSIS — Z720 Tobacco use: Secondary | ICD-10-CM | POA: Diagnosis not present

## 2023-08-18 DIAGNOSIS — Z8249 Family history of ischemic heart disease and other diseases of the circulatory system: Secondary | ICD-10-CM | POA: Diagnosis not present

## 2023-08-18 DIAGNOSIS — M1990 Unspecified osteoarthritis, unspecified site: Secondary | ICD-10-CM | POA: Diagnosis not present

## 2023-08-25 ENCOUNTER — Encounter: Payer: Self-pay | Admitting: *Deleted

## 2023-09-02 ENCOUNTER — Other Ambulatory Visit: Payer: Self-pay

## 2023-09-02 ENCOUNTER — Emergency Department (HOSPITAL_COMMUNITY)
Admission: EM | Admit: 2023-09-02 | Discharge: 2023-09-02 | Disposition: A | Discharge status: Home / Self Care | Payer: Medicare PPO | Attending: Emergency Medicine | Admitting: Emergency Medicine

## 2023-09-02 ENCOUNTER — Emergency Department (HOSPITAL_COMMUNITY): Payer: Medicare PPO

## 2023-09-02 ENCOUNTER — Encounter (HOSPITAL_COMMUNITY): Payer: Self-pay

## 2023-09-02 DIAGNOSIS — Z79899 Other long term (current) drug therapy: Secondary | ICD-10-CM | POA: Diagnosis not present

## 2023-09-02 DIAGNOSIS — M79605 Pain in left leg: Secondary | ICD-10-CM | POA: Diagnosis not present

## 2023-09-02 DIAGNOSIS — I10 Essential (primary) hypertension: Secondary | ICD-10-CM | POA: Diagnosis not present

## 2023-09-02 DIAGNOSIS — R519 Headache, unspecified: Secondary | ICD-10-CM | POA: Diagnosis present

## 2023-09-02 LAB — CBC WITH DIFFERENTIAL/PLATELET
Abs Immature Granulocytes: 0.07 10*3/uL (ref 0.00–0.07)
Basophils Absolute: 0 10*3/uL (ref 0.0–0.1)
Basophils Relative: 0 %
Eosinophils Absolute: 0.1 10*3/uL (ref 0.0–0.5)
Eosinophils Relative: 1 %
HCT: 47 % — ABNORMAL HIGH (ref 36.0–46.0)
Hemoglobin: 15.8 g/dL — ABNORMAL HIGH (ref 12.0–15.0)
Immature Granulocytes: 1 %
Lymphocytes Relative: 27 %
Lymphs Abs: 3.6 10*3/uL (ref 0.7–4.0)
MCH: 36.2 pg — ABNORMAL HIGH (ref 26.0–34.0)
MCHC: 33.6 g/dL (ref 30.0–36.0)
MCV: 107.8 fL — ABNORMAL HIGH (ref 80.0–100.0)
Monocytes Absolute: 0.7 10*3/uL (ref 0.1–1.0)
Monocytes Relative: 5 %
Neutro Abs: 8.9 10*3/uL — ABNORMAL HIGH (ref 1.7–7.7)
Neutrophils Relative %: 66 %
Platelets: 195 10*3/uL (ref 150–400)
RBC: 4.36 MIL/uL (ref 3.87–5.11)
RDW: 15.3 % (ref 11.5–15.5)
WBC: 13.4 10*3/uL — ABNORMAL HIGH (ref 4.0–10.5)
nRBC: 0 % (ref 0.0–0.2)

## 2023-09-02 LAB — BASIC METABOLIC PANEL
Anion gap: 9 (ref 5–15)
BUN: 10 mg/dL (ref 6–20)
CO2: 29 mmol/L (ref 22–32)
Calcium: 9.1 mg/dL (ref 8.9–10.3)
Chloride: 99 mmol/L (ref 98–111)
Creatinine, Ser: 0.78 mg/dL (ref 0.44–1.00)
GFR, Estimated: >60 mL/min (ref >60)
Glucose, Bld: 107 mg/dL — ABNORMAL HIGH (ref 70–99)
Potassium: 3.3 mmol/L — ABNORMAL LOW (ref 3.5–5.1)
Sodium: 137 mmol/L (ref 135–145)

## 2023-09-02 MED ORDER — TRAMADOL HCL 50 MG PO TABS
50.0000 mg | ORAL_TABLET | Freq: Four times a day (QID) | ORAL | 0 refills | Status: DC | PRN
Start: 2023-09-02 — End: 2023-11-14

## 2023-09-02 MED ORDER — TRAMADOL HCL 50 MG PO TABS
50.0000 mg | ORAL_TABLET | Freq: Once | ORAL | Status: AC
  Administered 2023-09-02: 50 mg via ORAL
  Filled 2023-09-02: qty 1

## 2023-09-02 NOTE — ED Triage Notes (Signed)
Pt to ED with c/o high blood pressure that has been going on "for a while" pt was prescribed amlodipine that she started Wednesday. Pt says she feels fine at this time. BP in triage is 148/99.

## 2023-09-02 NOTE — Discharge Instructions (Signed)
Begin taking tramadol as prescribed as needed for pain.  Follow-up with your primary doctor if symptoms are not improving in the next few days.

## 2023-09-02 NOTE — ED Notes (Signed)
Patient transported to CT 

## 2023-09-02 NOTE — ED Notes (Signed)
Pt's oxygen saturation dropped to 84% while sleeping, no improvement after a few minutes of being awake, talking & position change. Pt placed on 2L  with improvement to 94%. EDP made aware.

## 2023-09-02 NOTE — ED Notes (Signed)
Patient 02 sat reading 83-90 on room air. Charge RN Melissa made aware.

## 2023-09-02 NOTE — ED Provider Notes (Signed)
[place] EMERGENCY DEPARTMENT AT [facility] Provider Note   CSN: [id number] Arrival date & time: 09/02/23  0042     History  Chief Complaint  Patient presents with   Hypertension    [name] is a 47 y.o. female.  Patient is a 47 year old female presenting with complaints of elevated blood pressure, headache, and spasms in her left leg.  She apparently has been having headaches for several days, then checked her blood pressure this evening and found that it was running nearly 200 systolic.  She also describes pressure throughout her head along with increased spasming in her left leg.  She reports having taken ibuprofen with little relief.  She is also taking amlodipine for her hypertension.  Patient arrives here with blood pressure of 148/99.  The history is provided by the patient.       Home Medications Prior to Admission medications   Medication Sig Start Date End Date Taking? Authorizing Provider  metroNIDAZOLE (FLAGYL) 500 MG tablet Take 1 tablet (500 mg total) by mouth 2 (two) times daily. 03/30/23   Adline Potter, NP  albuterol (VENTOLIN HFA) 108 (90 Base) MCG/ACT inhaler Inhale 1-2 puffs into the lungs every 6 (six) hours as needed for wheezing or shortness of breath. 06/07/23   Gloris Manchester, MD  azithromycin (ZITHROMAX) 250 MG tablet Take 1 tablet (250 mg total) by mouth daily. Take first 2 tablets together, then 1 every day until finished. 06/07/23   Gloris Manchester, MD  benztropine (COGENTIN) 1 MG tablet Take 1 mg by mouth 2 (two) times daily. 02/24/20   [provider]  benztropine (COGENTIN) 1 MG tablet Take 1 tablet by mouth 2 (two) times daily. Patient not taking: Reported on 03/24/2023    [provider]  cholecalciferol (VITAMIN D3) 25 MCG (1000 UNIT) tablet Take 1,000 Units by mouth daily.    [provider]  citalopram (CELEXA) 20 MG tablet Take 20 mg by mouth daily. Patient not taking: Reported on 03/24/2023 02/27/22    [provider]  citalopram (CELEXA) 20 MG tablet Take by mouth. Patient not taking: Reported on 03/24/2023 10/28/22   [provider]  haloperidol (HALDOL) 5 MG tablet Take 5 mg by mouth 2 (two) times daily. Patient not taking: Reported on 03/24/2023 02/24/20   [provider]  haloperidol (HALDOL) 5 MG tablet Take by mouth. 10/28/22   [provider]  hydrOXYzine (ATARAX/VISTARIL) 50 MG tablet Take one (1) tablet by mouth three times a day 05/30/19   [provider]  ibuprofen (ADVIL) 200 MG tablet Take 200 mg by mouth as needed. Takes 2 tablets as needed.    [provider]  meclizine (ANTIVERT) 25 MG tablet Take 1 tablet (25 mg total) by mouth 3 (three) times daily as needed for dizziness. 07/29/22   Clark, Meghan R, PA-C  meloxicam (MOBIC) 7.5 MG tablet Take 7.5 mg by mouth 2 (two) times daily as needed. 02/15/22   [provider]  methocarbamol (ROBAXIN) 500 MG tablet  05/26/22   [provider]  nystatin (MYCOSTATIN/NYSTOP) powder APPLY POWDER TOPICALLY TO AFFECTED AREA TWICE DAILY AS NEEDED FOR SKIN RASH OR IRRITAION    [provider]  traZODone (DESYREL) 100 MG tablet Take 200 mg by mouth at bedtime as needed. 02/24/20   [provider]  zinc oxide (MEIJER ZINC OXIDE) 20 % ointment Apply 1 Application topically as needed for irritation. 02/12/23   Jeanelle Malling, PA      Allergies  Acetaminophen, Banana, Egg-derived products, and Tomato    Review of Systems   Review of Systems  All other systems reviewed and are negative.   Physical Exam Updated Vital Signs BP (!) 148/99   Pulse 90   Temp 99.4 F (37.4 C) (Oral)   Resp 18   Ht 5\' 2"  (1.575 m)   Wt 117.9 kg   SpO2 94%   BMI 47.54 kg/m  Physical Exam Vitals and nursing note reviewed.  Constitutional:      General: She is not in acute distress.    Appearance: She is well-developed. She is not diaphoretic.  HENT:     Head: Normocephalic and atraumatic.   Eyes:     Extraocular Movements: Extraocular movements intact.     Pupils: Pupils are equal, round, and reactive to light.  Cardiovascular:     Rate and Rhythm: Normal rate and regular rhythm.     Heart sounds: No murmur heard.    No friction rub. No gallop.  Pulmonary:     Effort: Pulmonary effort is normal. No respiratory distress.     Breath sounds: Normal breath sounds. No wheezing.  Abdominal:     General: Bowel sounds are normal. There is no distension.     Palpations: Abdomen is soft.     Tenderness: There is no abdominal tenderness.  Musculoskeletal:        General: Normal range of motion.     Cervical back: Normal range of motion and neck supple.  Skin:    General: Skin is warm and dry.  Neurological:     General: No focal deficit present.     Mental Status: She is alert and oriented to person, place, and time.     Cranial Nerves: No cranial nerve deficit.     Motor: No weakness.     ED Results / Procedures / Treatments   Labs (all labs ordered are listed, but only abnormal results are displayed) Labs Reviewed - No data to display  EKG None  Radiology No results found.  Procedures Procedures    Medications Ordered in ED Medications - No data to display  ED Course/ Medical Decision Making/ A&P  Patient is a 47 year old female presenting with multiple complaints that are all seemingly unrelated.  She complains of headache, pain in her left leg, and elevated blood pressure.  Patient arrives here with stable vital signs and physical examination which is basically unremarkable.  She is neurologically intact in the left lower extremity is neurovascularly intact.  Laboratory studies obtained including CBC and basic metabolic panel, both of which are unremarkable.  CT scan of the head obtained showing opacification of the left mastoid air cells, but shows no intracranial abnormality otherwise.  Patient was given tramadol for pain.  At this point, nothing  appears emergent and I feel as though patient can safely be discharged.  Final Clinical Impression(s) / ED Diagnoses Final diagnoses:  None    Rx / DC Orders ED Discharge Orders     None         Geoffery Lyons, MD 09/02/23 0401

## 2023-09-02 NOTE — ED Notes (Signed)
EDP at bedside  

## 2023-09-02 NOTE — ED Notes (Signed)
Pt's oxygen saturation 94% or higher while awake. Pt ambulated independently to restroom with normal gait.

## 2023-09-05 ENCOUNTER — Ambulatory Visit (INDEPENDENT_AMBULATORY_CARE_PROVIDER_SITE_OTHER): Payer: Medicare PPO | Admitting: Adult Health

## 2023-09-05 ENCOUNTER — Encounter: Payer: Self-pay | Admitting: Adult Health

## 2023-09-05 VITALS — BP 132/75 | HR 94 | Ht 64.0 in | Wt 229.0 lb

## 2023-09-05 DIAGNOSIS — R61 Generalized hyperhidrosis: Secondary | ICD-10-CM | POA: Diagnosis not present

## 2023-09-05 DIAGNOSIS — R232 Flushing: Secondary | ICD-10-CM | POA: Diagnosis not present

## 2023-09-05 DIAGNOSIS — Z79890 Hormone replacement therapy (postmenopausal): Secondary | ICD-10-CM

## 2023-09-05 DIAGNOSIS — G479 Sleep disorder, unspecified: Secondary | ICD-10-CM | POA: Diagnosis not present

## 2023-09-05 DIAGNOSIS — Z780 Asymptomatic menopausal state: Secondary | ICD-10-CM | POA: Insufficient documentation

## 2023-09-05 DIAGNOSIS — R4589 Other symptoms and signs involving emotional state: Secondary | ICD-10-CM | POA: Insufficient documentation

## 2023-09-05 MED ORDER — ESTRADIOL 1 MG PO TABS
ORAL_TABLET | ORAL | 3 refills | Status: DC
Start: 2023-09-05 — End: 2023-11-14

## 2023-09-05 MED ORDER — PROGESTERONE 200 MG PO CAPS: ORAL_CAPSULE | ORAL | 3 refills | Status: DC

## 2023-09-05 NOTE — Progress Notes (Signed)
  Subjective:     Patient ID: [name], female   DOB: 12-24-1975, 47 y.o.   MRN: [id number]  HPI [name] is a 47 year old black female, single, PM in complaining of hot flashes and night sweats, moody and not sleeping well.     Component Value Date/Time   DIAGPAP  03/24/2023 0925    - Negative for intraepithelial lesion or malignancy (NILM)   DIAGPAP  04/07/2020 1032    - Negative for intraepithelial lesion or malignancy (NILM)   HPVHIGH Negative 03/24/2023 0925   HPVHIGH Negative 04/07/2020 1032   ADEQPAP  03/24/2023 0925    Satisfactory for evaluation; transformation zone component ABSENT.   ADEQPAP  04/07/2020 1032    Satisfactory for evaluation; transformation zone component ABSENT.   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    Denies any vaginal bleeding Reviewed past medical,surgical, social and family history. Reviewed medications and allergies.  Objective:   Physical Exam BP 132/75 (BP Location: Right Arm, Patient Position: Sitting, Cuff Size: Normal)   Pulse 94   Ht 5\' 4"  (1.626 m)   Wt 229 lb (103.9 kg)   BMI 39.31 kg/m     Skin warm and dry. Lungs: clear to ausculation bilaterally. Cardiovascular: regular rate and rhythm.    Assessment:     1. Hot flashes Will try HRT   2. Night sweats  3. Sleep disturbance  4. Moody  5. Postmenopause Denies any vaginal bleeding   6. Hormone replacement therapy Denies MI,stroke, DVT, breast cancer Wants to try oral HRT Will rx estrace 1 mg 1 daily and Prometrium 200 mg 1 daily at HS  Meds ordered this encounter  Medications   progesterone (PROMETRIUM) 200 MG capsule    Sig: Take 1 daily at bedtime    Dispense:  30 capsule    Refill:  3    Order Specific Question:   Supervising Provider    Answer:   Duane Lope H [[id number]]   estradiol (ESTRACE) 1 MG tablet    Sig: Take 1 daily in am    Dispense:  30 tablet    Refill:  3    Order Specific  Question:   Supervising Provider    Answer:   Lazaro Arms [[id number]]       Plan:     Follow up in 10 weeks for ROS

## 2023-09-07 ENCOUNTER — Encounter: Payer: Self-pay | Admitting: *Deleted

## 2023-09-13 ENCOUNTER — Encounter (HOSPITAL_COMMUNITY): Payer: Self-pay | Admitting: *Deleted

## 2023-09-13 ENCOUNTER — Other Ambulatory Visit: Payer: Self-pay

## 2023-09-13 ENCOUNTER — Emergency Department (HOSPITAL_COMMUNITY)
Admission: EM | Admit: 2023-09-13 | Discharge: 2023-09-13 | Disposition: A | Discharge status: Home / Self Care | Payer: Medicare PPO | Attending: Emergency Medicine | Admitting: Emergency Medicine

## 2023-09-13 DIAGNOSIS — H7291 Unspecified perforation of tympanic membrane, right ear: Secondary | ICD-10-CM | POA: Insufficient documentation

## 2023-09-13 DIAGNOSIS — H9201 Otalgia, right ear: Secondary | ICD-10-CM | POA: Diagnosis present

## 2023-09-13 MED ORDER — CIPROFLOXACIN-DEXAMETHASONE 0.3-0.1 % OT SUSP
4.0000 [drp] | Freq: Two times a day (BID) | OTIC | Status: DC
  Filled 2023-09-13: qty 7.5

## 2023-09-13 MED ORDER — CIPROFLOXACIN-HYDROCORTISONE 0.2-1 % OT SUSP: 3.0000 [drp] | Freq: Two times a day (BID) | OTIC | Status: DC

## 2023-09-13 MED ORDER — TRANEXAMIC ACID FOR EPISTAXIS
500.0000 mg | Freq: Once | TOPICAL | Status: AC
  Administered 2023-09-13: 500 mg via TOPICAL
  Filled 2023-09-13: qty 10

## 2023-09-13 NOTE — Discharge Instructions (Signed)
Use the ear medicine twice a day for the next 5 days. Make an appointment with the ear doctor (Dr. Elijah Birk) for recheck later this week.

## 2023-09-13 NOTE — ED Provider Notes (Signed)
[place] EMERGENCY DEPARTMENT AT [facility] Provider Note   CSN: [id number] Arrival date & time: 09/13/23  1707     History  Chief Complaint  Patient presents with   Bleeding/Bruising    [name] is a 47 y.o. female.  Patient to ED for evaluation of sudden onset of bleeding from the right ear. No known injury. She denies inserting anything into the ear. No hearing change. No similar symptoms in the past.   The history is provided by the patient. No language interpreter was used.       Home Medications Prior to Admission medications   Medication Sig Start Date End Date Taking? Authorizing Provider  albuterol (VENTOLIN HFA) 108 (90 Base) MCG/ACT inhaler Inhale 1-2 puffs into the lungs every 6 (six) hours as needed for wheezing or shortness of breath. 06/07/23   Gloris Manchester, MD  amLODipine (NORVASC) 10 MG tablet Take 1 tablet every day by oral route with meal(s), for Blood pressure. 08/31/23   [provider]  citalopram (CELEXA) 10 MG tablet Take 10 mg by mouth daily.    [provider]  estradiol (ESTRACE) 1 MG tablet Take 1 daily in am 09/05/23   Cyril Mourning A, NP  loxapine (LOXITANE) 50 MG capsule Take 1 capsule by mouth 2 (two) times daily.    [provider]  meclizine (ANTIVERT) 25 MG tablet Take 1 tablet (25 mg total) by mouth 3 (three) times daily as needed for dizziness. 07/29/22   Clark, Meghan R, PA-C  meloxicam (MOBIC) 7.5 MG tablet Take 7.5 mg by mouth 2 (two) times daily as needed. 02/15/22   [provider]  methocarbamol (ROBAXIN) 500 MG tablet  05/26/22   [provider]  nystatin (MYCOSTATIN/NYSTOP) powder APPLY POWDER TOPICALLY TO AFFECTED AREA TWICE DAILY AS NEEDED FOR SKIN RASH OR IRRITAION Patient not taking: Reported on 09/05/2023    [provider]  progesterone (PROMETRIUM) 200 MG capsule Take 1 daily at bedtime 09/05/23   Cyril Mourning A, NP  traMADol (ULTRAM) 50 MG tablet  Take 1 tablet (50 mg total) by mouth every 6 (six) hours as needed. 09/02/23   Geoffery Lyons, MD  zinc oxide [facility] ZINC OXIDE) 20 % ointment Apply 1 Application topically as needed for irritation. Patient not taking: Reported on 09/05/2023 02/12/23   Jeanelle Malling, PA      Allergies    Banana, Egg-derived products, and Tomato    Review of Systems   Review of Systems  Physical Exam Updated Vital Signs BP 122/89 (BP Location: Right Arm)   Pulse 94   Temp 99.1 F (37.3 C) (Oral)   Resp 18   Ht 5\' 4"  (1.626 m)   Wt 103.8 kg   SpO2 96%   BMI 39.28 kg/m  Physical Exam Vitals and nursing note reviewed.  Constitutional:      Appearance: Normal appearance.  HENT:     Head:     Comments: There is blood accumulating the external canal of right ear. The TM appears normal with only the inferior border obscured and not visible. No hemotympanum.     Right Ear: Tympanic membrane normal.  Lymphadenopathy:     Head:     Right side of head: No preauricular or posterior auricular adenopathy.     Left side of head: No preauricular or posterior auricular adenopathy.  Skin:    General: Skin is warm and dry.  Neurological:     Mental Status: She is alert.  ED Results / Procedures / Treatments   Labs (all labs ordered are listed, but only abnormal results are displayed) Labs Reviewed - No data to display  EKG None  Radiology No results found.  Procedures Procedures    Medications Ordered in ED Medications  tranexamic acid (CYKLOKAPRON) 1000 MG/10ML topical solution 500 mg (500 mg Topical Given 09/13/23 1903)    ED Course/ Medical Decision Making/ A&P Clinical Course as of 09/21/23 2310  Tue Sep 13, 2023  1940 I attempted to clear the canal of the right ear with a q-tip swab. She reports pain while doing this. No site of bleed is visualized. Without hearing change or hemotympanum, doubt TM perforation but cannot rule it out.   Will provide antibiotic suspension and ENT referral.   [SU]    Clinical Course User Index [SU] Elpidio Anis, PA-C                                 Medical Decision Making Risk Prescription drug management.           Final Clinical Impression(s) / ED Diagnoses Final diagnoses:  Perforation of right tympanic membrane    Rx / DC Orders ED Discharge Orders     None         Elpidio Anis, PA-C 09/21/23 2311    Royanne Foots, DO 09/30/23 1704

## 2023-09-13 NOTE — ED Triage Notes (Signed)
Pt c/o sudden bleeding from right ear; pt denies any pain and states she did not put anything in her ear

## 2023-11-14 ENCOUNTER — Ambulatory Visit: Payer: Medicare PPO | Admitting: Adult Health

## 2023-11-14 ENCOUNTER — Encounter: Payer: Self-pay | Admitting: Adult Health

## 2023-11-14 VITALS — BP 129/86 | HR 90 | Ht 62.0 in | Wt 222.0 lb

## 2023-11-14 DIAGNOSIS — G479 Sleep disorder, unspecified: Secondary | ICD-10-CM

## 2023-11-14 DIAGNOSIS — Z79890 Hormone replacement therapy (postmenopausal): Secondary | ICD-10-CM

## 2023-11-14 DIAGNOSIS — R61 Generalized hyperhidrosis: Secondary | ICD-10-CM | POA: Diagnosis not present

## 2023-11-14 DIAGNOSIS — I10 Essential (primary) hypertension: Secondary | ICD-10-CM

## 2023-11-14 DIAGNOSIS — Z780 Asymptomatic menopausal state: Secondary | ICD-10-CM

## 2023-11-14 DIAGNOSIS — R232 Flushing: Secondary | ICD-10-CM | POA: Diagnosis not present

## 2023-11-14 DIAGNOSIS — R4589 Other symptoms and signs involving emotional state: Secondary | ICD-10-CM

## 2023-11-14 MED ORDER — ESTRADIOL 2 MG PO TABS: 2.0000 mg | ORAL_TABLET | Freq: Every day | ORAL | 3 refills | Status: DC

## 2023-11-14 MED ORDER — PROGESTERONE 200 MG PO CAPS: ORAL_CAPSULE | ORAL | 3 refills | Status: DC

## 2023-11-14 NOTE — Progress Notes (Signed)
  Subjective:     Patient ID: [name], female   DOB: 01/20/1976, 48 y.o.   MRN: [id number]  HPI [name] is a 48 year old black female,single, PM back in follow up on HRT and no hot flashes in morning but has night sweats, and not sleeping well and still moody.     Component Value Date/Time   DIAGPAP  03/24/2023 0925    - Negative for intraepithelial lesion or malignancy (NILM)   DIAGPAP  04/07/2020 1032    - Negative for intraepithelial lesion or malignancy (NILM)   HPVHIGH Negative 03/24/2023 0925   HPVHIGH Negative 04/07/2020 1032   ADEQPAP  03/24/2023 0925    Satisfactory for evaluation; transformation zone component ABSENT.   ADEQPAP  04/07/2020 1032    Satisfactory for evaluation; transformation zone component ABSENT.   PCP is Kathryne Sharper NP Review of Systems Hot flashes are gone in am, but has night sweats Still not sleeping well and moody Reviewed past medical,surgical, social and family history. Reviewed medications and allergies.     Objective:   Physical Exam BP 129/86 (BP Location: Right Arm, Patient Position: Sitting, Cuff Size: Normal)   Pulse 90   Ht 5\' 2"  (1.575 m)   Wt 222 lb (100.7 kg)   BMI 40.60 kg/m     Skin warm and dry.  Lungs: clear to ausculation bilaterally. Cardiovascular: regular rate and rhythm.   Upstream - 11/14/23 1002       Pregnancy Intention Screening   Does the patient want to become pregnant in the next year? N/A    Does the patient's partner want to become pregnant in the next year? N/A    Would the patient like to discuss contraceptive options today? N/A      Contraception Wrap Up   Current Method Abstinence   PM   End Method Abstinence   PM   Contraception Counseling Provided No             Assessment:     1. Hot flashes  Hot  flashes have stopped in am   2. Night sweats Having night sweats  Will increase estrace to 2 mg 1 daily can take at night if desired   3. Moody Still moody  4. Sleep  disturbance Still not sleeping well   5. Postmenopause  6. Hormone replacement therapy Meds ordered this encounter  Medications   estradiol (ESTRACE) 2 MG tablet    Sig: Take 1 tablet (2 mg total) by mouth daily.    Dispense:  30 tablet    Refill:  3    Supervising Provider:   Duane Lope H [[id number]]   progesterone (PROMETRIUM) 200 MG capsule    Sig: Take 1 daily at bedtime    Dispense:  30 capsule    Refill:  3    Supervising Provider:   Duane Lope H [[id number]]     7. Hypertension, unspecified type Take BP meds and follow up with PCP    Plan:     Follow up in 3 months for ROS

## 2023-11-17 ENCOUNTER — Encounter: Payer: Self-pay | Admitting: Podiatry

## 2023-11-17 ENCOUNTER — Ambulatory Visit (INDEPENDENT_AMBULATORY_CARE_PROVIDER_SITE_OTHER): Payer: Medicare PPO | Admitting: Podiatry

## 2023-11-17 DIAGNOSIS — B351 Tinea unguium: Secondary | ICD-10-CM

## 2023-11-17 DIAGNOSIS — G6289 Other specified polyneuropathies: Secondary | ICD-10-CM | POA: Diagnosis not present

## 2023-11-17 DIAGNOSIS — M79675 Pain in left toe(s): Secondary | ICD-10-CM

## 2023-11-17 DIAGNOSIS — M79674 Pain in right toe(s): Secondary | ICD-10-CM | POA: Diagnosis not present

## 2023-11-17 NOTE — Progress Notes (Signed)
This patient presents to the office with chief complaint of long thick painful nails.  Patient says the nails are painful walking and wearing shoes.  This patient is unable to self treat.  This patient is unable to trim her nails since she is unable to reach her nails.  She presents to the office for preventative foot care services.  General Appearance  Alert, conversant and in no acute stress.  Vascular  Dorsalis pedis and posterior tibial  pulses are palpable  bilaterally.  Capillary return is within normal limits  bilaterally. Temperature is within normal limits  bilaterally.  Neurologic  Senn-Weinstein monofilament wire test within normal limits  bilaterally. Muscle power within normal limits bilaterally.  Nails Thick disfigured discolored nails with subungual debris  from hallux to fifth toes bilaterally. No evidence of bacterial infection or drainage bilaterally.  Orthopedic  No limitations of motion  feet .  No crepitus or effusions noted.  No bony pathology or digital deformities noted.  Skin  normotropic skin with no porokeratosis noted bilaterally.  No signs of infections or ulcers noted.     Onychomycosis  Nails  B/L.  Pain in right toes  Pain in left toes  Debridement of nails both feet followed trimming the nails with dremel tool.    Patient says her toes remain numb and requests an appointment for evaluation.  To consider medication for her symptoms.   RTC 3 months.   Helane Gunther DPM

## 2023-11-28 ENCOUNTER — Telehealth (INDEPENDENT_AMBULATORY_CARE_PROVIDER_SITE_OTHER): Payer: Self-pay | Admitting: Otolaryngology

## 2023-11-28 NOTE — Telephone Encounter (Signed)
Called and left vm to confirm address for both appointments on 11/29/2023.

## 2023-11-29 ENCOUNTER — Institutional Professional Consult (permissible substitution) (INDEPENDENT_AMBULATORY_CARE_PROVIDER_SITE_OTHER): Payer: Medicare PPO

## 2023-11-29 ENCOUNTER — Ambulatory Visit (INDEPENDENT_AMBULATORY_CARE_PROVIDER_SITE_OTHER): Payer: Medicare PPO | Admitting: Audiology

## 2023-11-29 ENCOUNTER — Telehealth: Payer: Self-pay | Admitting: Adult Health

## 2023-11-29 NOTE — Telephone Encounter (Signed)
Left message @ 12:19 pm. JSY

## 2023-11-29 NOTE — Telephone Encounter (Signed)
Pt states blood was in her urine. Pt is asking for a call from the office

## 2023-11-30 NOTE — Telephone Encounter (Signed)
 Pt had blood in urine the past few days. No blood today. No urinary symptoms. Advised to keep a check on it and if she notices it again, let us  know so we can check her urine. Pt voiced understanding. JSY

## 2023-11-30 NOTE — Telephone Encounter (Signed)
Left message @ 12:30 pm. JSY 

## 2023-12-01 ENCOUNTER — Ambulatory Visit (INDEPENDENT_AMBULATORY_CARE_PROVIDER_SITE_OTHER): Payer: Medicare PPO | Admitting: Podiatry

## 2023-12-01 ENCOUNTER — Encounter: Payer: Self-pay | Admitting: Podiatry

## 2023-12-01 DIAGNOSIS — G63 Polyneuropathy in diseases classified elsewhere: Secondary | ICD-10-CM | POA: Diagnosis not present

## 2023-12-01 DIAGNOSIS — E538 Deficiency of other specified B group vitamins: Secondary | ICD-10-CM

## 2023-12-01 NOTE — Patient Instructions (Signed)
 VISIT SUMMARY:  Today, we discussed the numbness in your toes that you have been experiencing for the past eight months. We reviewed your symptoms, medical history, and current treatments.  YOUR PLAN:  -PERIPHERAL NEUROPATHY: Peripheral neuropathy is a condition where the nerves outside of your brain and spinal cord are damaged, often causing numbness, tingling, or weakness. In your case, it is likely due to a vitamin B12 deficiency. You should continue taking your B12 supplements as prescribed by your primary care doctor. Regular walking is encouraged to help stimulate circulation in your feet. We will recheck your B12 levels in March or April during your follow-up visit with your primary care physician. No additional testing is needed at this time.  INSTRUCTIONS:  Please continue with your B12 supplementation and maintain regular walking activity. We will recheck your B12 levels in March or April during your follow-up visit with your primary care physician.

## 2023-12-01 NOTE — Progress Notes (Signed)
  Subjective:  Patient ID: [name], female    DOB: 04-18-1976,  MRN: [id number]  Chief Complaint  Patient presents with   neuopathy      I have no feeling in my toes    Discussed the use of AI scribe software for clinical note transcription with the patient, who gave verbal consent to proceed.  History of Present Illness   [name] is a 48 year old female who presents with numbness in her toes for eight months.  She has been experiencing numbness in all toes on both feet for the past eight months, with a complete lack of sensation and no associated pain. The numbness affects her ability to walk, as she tends to put more pressure on the bottom of her feet due to the lack of feeling.  She recently began taking vitamin B12 supplements after a low level was identified by her primary care doctor. She smokes three to five cigarettes a day and has been doing so for 27 years. She denies regular alcohol consumption, diabetes, HIV, syphilis, or other infections. No low back issues or spine problems.          Objective:    Physical Exam   CARDIOVASCULAR: Pulses palpable in both feet. Good capillary refill time bilaterally. EXTREMITIES: Feet warm and well perfused bilaterally. NEUROLOGICAL: Decreased sensation to light touch and vibration at the MTP joints bilaterally.       No images are attached to the encounter.    Results   LABS Vitamin B12: low CBC: macrocytic anemia      Assessment:   1. Vitamin B12 deficiency neuropathy (HCC)      Plan:  Patient was evaluated and treated and all questions answered.  Assessment and Plan    Peripheral Neuropathy Numbness in toes for 8 months, likely secondary to B12 deficiency. No pain or tingling. Good circulation noted in feet. No history of diabetes, significant alcohol use, or infectious diseases. Patient is a smoker. -Continue B12 supplementation as prescribed by primary care physician. -Encourage patient to  maintain regular walking activity to stimulate circulation. -Plan to recheck B12 levels in March/April during follow-up with primary care physician. -No additional testing recommended at this time.          Return if symptoms worsen or fail to improve.

## 2023-12-26 ENCOUNTER — Other Ambulatory Visit (HOSPITAL_COMMUNITY): Payer: Self-pay | Admitting: Adult Health

## 2023-12-26 DIAGNOSIS — R921 Mammographic calcification found on diagnostic imaging of breast: Secondary | ICD-10-CM

## 2024-01-18 ENCOUNTER — Telehealth (INDEPENDENT_AMBULATORY_CARE_PROVIDER_SITE_OTHER): Payer: Self-pay | Admitting: Otolaryngology

## 2024-01-18 NOTE — Telephone Encounter (Signed)
 Confirmed location for both appts with patient for 01/19/2024.

## 2024-01-19 ENCOUNTER — Ambulatory Visit (INDEPENDENT_AMBULATORY_CARE_PROVIDER_SITE_OTHER): Payer: Medicare PPO | Admitting: Audiology

## 2024-01-19 ENCOUNTER — Encounter (INDEPENDENT_AMBULATORY_CARE_PROVIDER_SITE_OTHER): Payer: Self-pay

## 2024-01-19 ENCOUNTER — Ambulatory Visit (INDEPENDENT_AMBULATORY_CARE_PROVIDER_SITE_OTHER): Payer: Medicare PPO | Admitting: Otolaryngology

## 2024-01-19 VITALS — BP 145/77 | HR 74 | Ht 62.0 in | Wt 228.0 lb

## 2024-01-19 DIAGNOSIS — H6123 Impacted cerumen, bilateral: Secondary | ICD-10-CM | POA: Diagnosis not present

## 2024-01-19 DIAGNOSIS — H7202 Central perforation of tympanic membrane, left ear: Secondary | ICD-10-CM

## 2024-01-19 MED ORDER — CIPROFLOXACIN-DEXAMETHASONE 0.3-0.1 % OT SUSP: 4.0000 [drp] | Freq: Two times a day (BID) | OTIC | 8 refills | Status: AC

## 2024-01-19 NOTE — Progress Notes (Unsigned)
 Patient ID: [name], female   DOB: September 18, 1976, 48 y.o.   MRN: [id number]  CC: Left ear drainage  HPI:  [name] is a 48 y.o. female who presents today complaining of intermittent left ear drainage for the past 6 months.  She was previously diagnosed with a left tympanic membrane perforation.  She has also noted occasional otalgia.  Currently she is not using any otologic medication.  She has no previous otologic surgery.  She has a history of recurrent ear infections as a child.  She denies any hearing difficulty.  Past Medical History:  Diagnosis Date   Allergy    Anxiety    Arthritis    Asthma    Bipolar 1 disorder (HCC)    Depression    Ectopic pregnancy    Encounter for screening fecal occult blood testing 03/24/2023   Hepatitis B infection    Hyperlipidemia    Hypertension    Schizophrenia (HCC)     Past Surgical History:  Procedure Laterality Date   DIAGNOSTIC LAPAROSCOPY WITH REMOVAL OF ECTOPIC PREGNANCY      Family History  Problem Relation Age of Onset   Heart disease Mother    Stroke Mother    Asthma Mother    Arthritis Mother    Depression Mother    Diabetes Mother    Hyperlipidemia Mother    Hypertension Mother    Hypertension Father    Hyperlipidemia Father     Social History:  reports that she has been smoking cigarettes. She started smoking about 18 years ago. She has a 4.6 pack-year smoking history. She has never used smokeless tobacco. She reports that she does not drink alcohol and does not use drugs.  Allergies:  Allergies  Allergen Reactions   Cantaloupe Extract Allergy Skin Test Anaphylaxis   Banana    Egg-Derived Products    Fluoxetine Nausea And Vomiting   Tomato     swelling    Prior to Admission medications   Medication Sig Start Date End Date Taking? Authorizing Provider  ABILIFY 15 MG tablet Take by mouth. 11/10/23  Yes [provider]  albuterol (VENTOLIN HFA) 108 (90 Base) MCG/ACT inhaler Inhale 1-2 puffs  into the lungs every 6 (six) hours as needed for wheezing or shortness of breath. 06/07/23  Yes Gloris Manchester, MD  amLODipine (NORVASC) 10 MG tablet Take 1 tablet every day by oral route with meal(s), for Blood pressure. 08/31/23  Yes [provider]  ciprofloxacin-dexamethasone (CIPRODEX) OTIC suspension INSTILL 4 DROPS INTO AFFECTED EAR(S) BY OTIC ROUTE 2 TIMES PER DAY FOR 7 DAYS   Yes [provider]  ciprofloxacin-dexamethasone (CIPRODEX) OTIC suspension Place 4 drops into both ears 2 (two) times daily for 7 days. 01/19/24 01/26/24 Yes Newman Pies, MD  estradiol (ESTRACE) 2 MG tablet Take 1 tablet (2 mg total) by mouth daily. 11/14/23  Yes Cyril Mourning A, NP  hydrochlorothiazide (HYDRODIURIL) 50 MG tablet Take 1 tablet every day by oral route for 90 days. 10/11/23  Yes [provider]  meclizine (ANTIVERT) 25 MG tablet Take 1 tablet (25 mg total) by mouth 3 (three) times daily as needed for dizziness. 07/29/22  Yes Clark, Meghan R, PA-C  meloxicam (MOBIC) 7.5 MG tablet Take 7.5 mg by mouth 2 (two) times daily as needed. 02/15/22  Yes [provider]  methocarbamol (ROBAXIN) 500 MG tablet  05/26/22  Yes [provider]  progesterone (PROMETRIUM) 200 MG capsule Take 1 daily at bedtime 11/14/23  Yes Valentina Lucks,  Ginette Pitman, NP  SYMBICORT 160-4.5 MCG/ACT inhaler Inhale 2 puffs into the lungs 2 (two) times daily. 09/19/23  Yes [provider]    Blood pressure (!) 145/77, pulse 74, height 5\' 2"  (1.575 m), weight 228 lb (103.4 kg), SpO2 92%. Exam: General: Communicates without difficulty, well nourished, no acute distress. Head: Normocephalic, no evidence injury, no tenderness, facial buttresses intact without stepoff. Face/sinus: No tenderness to palpation and percussion. Facial movement is normal and symmetric. Eyes: PERRL, EOMI. No scleral icterus, conjunctivae clear. Neuro: CN II exam reveals vision grossly intact.  No nystagmus at any point of gaze. Ears:  Auricles well formed without lesions.  Bilateral cerumen impaction.  Nose: External evaluation reveals normal support and skin without lesions.  Dorsum is intact.  Anterior rhinoscopy reveals normal mucosa over anterior aspect of inferior turbinates and intact septum.  No purulence noted. Oral:  Oral cavity and oropharynx are intact, symmetric, without erythema or edema.  Mucosa is moist without lesions. Neck: Full range of motion without pain.  There is no significant lymphadenopathy.  No masses palpable.  Thyroid bed within normal limits to palpation.  Parotid glands and submandibular glands equal bilaterally without mass.  Trachea is midline. Neuro:  CN 2-12 grossly intact.   Procedure: Bilateral cerumen disimpaction Anesthesia: None Description: Under the operating microscope, the cerumen is carefully removed with a combination of cerumen currette, alligator forceps, and suction catheters.  After the cerumen is removed, the TMs are noted to be normal.  No mass, erythema, or lesions. The patient tolerated the procedure well.    Assessment: 1.  Bilateral cerumen impaction.  After the cerumen disimpaction procedure, her ear canals and tympanic membranes are noted to be normal. 2.  History of recurrent left ear infection and tympanic membrane perforation.  The perforation has healed.  Plan: 1.  Otomicroscopy with bilateral cerumen disimpaction. 2.  The physical exam findings are reviewed with the patient. 3.  The patient is reassured that no infection is noted today. 4.  Ciprodex eardrops as needed to treat any otorrhea in the future.  Desaree Downen W Quinnie Barcelo 01/19/2024, 3:30 PM

## 2024-01-20 DIAGNOSIS — H6123 Impacted cerumen, bilateral: Secondary | ICD-10-CM | POA: Insufficient documentation

## 2024-01-20 DIAGNOSIS — H7202 Central perforation of tympanic membrane, left ear: Secondary | ICD-10-CM | POA: Insufficient documentation

## 2024-02-13 ENCOUNTER — Encounter: Payer: Self-pay | Admitting: Adult Health

## 2024-02-13 ENCOUNTER — Ambulatory Visit (INDEPENDENT_AMBULATORY_CARE_PROVIDER_SITE_OTHER): Payer: Medicare PPO | Admitting: Adult Health

## 2024-02-13 VITALS — BP 107/74 | HR 89 | Ht 62.0 in | Wt 226.5 lb

## 2024-02-13 DIAGNOSIS — Z79890 Hormone replacement therapy (postmenopausal): Secondary | ICD-10-CM

## 2024-02-13 DIAGNOSIS — Z780 Asymptomatic menopausal state: Secondary | ICD-10-CM

## 2024-02-13 DIAGNOSIS — Z1331 Encounter for screening for depression: Secondary | ICD-10-CM | POA: Diagnosis not present

## 2024-02-13 DIAGNOSIS — R4589 Other symptoms and signs involving emotional state: Secondary | ICD-10-CM | POA: Diagnosis not present

## 2024-02-13 DIAGNOSIS — R232 Flushing: Secondary | ICD-10-CM

## 2024-02-13 DIAGNOSIS — R61 Generalized hyperhidrosis: Secondary | ICD-10-CM | POA: Diagnosis not present

## 2024-02-13 DIAGNOSIS — G479 Sleep disorder, unspecified: Secondary | ICD-10-CM

## 2024-02-13 MED ORDER — HYDROXYZINE HCL 10 MG PO TABS: 10.0000 mg | ORAL_TABLET | Freq: Three times a day (TID) | ORAL | 1 refills | Status: AC | PRN

## 2024-02-13 MED ORDER — ESTRADIOL 2 MG PO TABS: 2.0000 mg | ORAL_TABLET | Freq: Every day | ORAL | 6 refills | Status: DC

## 2024-02-13 MED ORDER — PROGESTERONE 200 MG PO CAPS
ORAL_CAPSULE | ORAL | 6 refills | Status: DC
Start: 2024-02-13 — End: 2024-06-14

## 2024-02-13 NOTE — Progress Notes (Signed)
 Subjective:     Patient ID: [name], female   DOB: [birth year], 48 y.o.   MRN: [id number]  HPI [name] is a 48 year old black female, single, PM back in follow up on taking estrace  and Prometrium  and hot flashes and night sweats are better. Still not sleeping well and moody.     Component Value Date/Time   DIAGPAP  03/24/2023 0925    - Negative for intraepithelial lesion or malignancy (NILM)   DIAGPAP  04/07/2020 1032    - Negative for intraepithelial lesion or malignancy (NILM)   HPVHIGH Negative 03/24/2023 0925   HPVHIGH Negative 04/07/2020 1032   ADEQPAP  03/24/2023 0925    Satisfactory for evaluation; transformation zone component ABSENT.   ADEQPAP  04/07/2020 1032    Satisfactory for evaluation; transformation zone component ABSENT.   PCP is Jerrie Morales NP  Review of Systems Hot flashes and night sweats much better Still not sleeping well Still moody Reviewed past medical,surgical, social and family history. Reviewed medications and allergies.     Objective:   Physical Exam BP 107/74 (BP Location: Left Arm, Patient Position: Sitting, Cuff Size: Large)   Pulse 89   Ht 5\' 2"  (1.575 m)   Wt 226 lb 8 oz (102.7 kg)   BMI 41.43 kg/m     Skin warm and dry.  Lungs: clear to ausculation bilaterally. Cardiovascular: regular rate and rhythm.  Fall risk is low    02/13/2024   10:18 AM 03/24/2023    8:35 AM 04/07/2020   10:29 AM  Depression screen PHQ 2/9  Decreased Interest 0 1 0  Down, Depressed, Hopeless 3 3 2   PHQ - 2 Score 3 4 2   Altered sleeping 3 1 2   Tired, decreased energy 3 1 3   Change in appetite 2 3 2   Feeling bad or failure about yourself  3 0 2  Trouble concentrating 0 0 0  Moving slowly or fidgety/restless 0 3 0  Suicidal thoughts 0 0 0  PHQ-9 Score 14 12 11   Difficult doing work/chores Somewhat difficult         02/13/2024   10:20 AM 03/24/2023    8:35 AM 04/07/2020   10:30 AM  GAD 7 : Generalized Anxiety Score  Nervous, Anxious, on Edge 3 3  3   Control/stop worrying 3 0 2  Worry too much - different things 3 1 3   Trouble relaxing 3 3 3   Restless 3 1 3   Easily annoyed or irritable 3 0 2  Afraid - awful might happen 3 0 0  Total GAD 7 Score 21 8 16   Anxiety Difficulty Somewhat difficult        Upstream - 02/13/24 1016       Pregnancy Intention Screening   Does the patient want to become pregnant in the next year? N/A    Does the patient's partner want to become pregnant in the next year? N/A    Would the patient like to discuss contraceptive options today? N/A      Contraception Wrap Up   Current Method Abstinence   PM   End Method Abstinence   PM   Contraception Counseling Provided No             Assessment:     1. Hot flashes (Primary) Much better on HRT  2. Night sweats Much better on HRT   3. Moody Still moody, is on Abilify, talk with MD that prescribes  Anxiety score was high, will try hydroxyzine  10  mg prn, do not take with antihistamine  4. Hormone replacement therapy Happy with estrace  2 mg and Prometrium  200 mg, will refill Meds ordered this encounter  Medications   progesterone  (PROMETRIUM ) 200 MG capsule    Sig: Take 1 daily at bedtime    Dispense:  30 capsule    Refill:  6    Supervising Provider:   Wendelyn Halter [[id number]]   estradiol  (ESTRACE ) 2 MG tablet    Sig: Take 1 tablet (2 mg total) by mouth daily.    Dispense:  30 tablet    Refill:  6    Supervising Provider:   Evalyn Hillier H [[id number]]   hydrOXYzine  (ATARAX ) 10 MG tablet    Sig: Take 1 tablet (10 mg total) by mouth 3 (three) times daily as needed.    Dispense:  30 tablet    Refill:  1    Supervising Provider:   Randolm Butte, LUTHER H [[id number]]     5. Sleep disturbance Still not sleeping well Will try hydroxyzine  to see if helps with sleep and being moody  6. Postmenopause Denies any vaginal bleeding     Plan:     Follow up in 4 months for ROS

## 2024-02-14 ENCOUNTER — Ambulatory Visit (HOSPITAL_COMMUNITY)
Admission: RE | Admit: 2024-02-14 | Discharge: 2024-02-14 | Disposition: A | Discharge status: Home / Self Care | Source: Ambulatory Visit | Attending: Adult Health

## 2024-02-14 ENCOUNTER — Ambulatory Visit (HOSPITAL_COMMUNITY)
Admission: RE | Admit: 2024-02-14 | Discharge: 2024-02-14 | Disposition: A | Discharge status: Home / Self Care | Source: Ambulatory Visit | Attending: Adult Health | Admitting: Adult Health

## 2024-02-14 ENCOUNTER — Encounter (HOSPITAL_COMMUNITY): Payer: Self-pay

## 2024-02-14 DIAGNOSIS — R921 Mammographic calcification found on diagnostic imaging of breast: Secondary | ICD-10-CM | POA: Diagnosis present

## 2024-02-16 ENCOUNTER — Ambulatory Visit: Payer: Medicare PPO | Admitting: Podiatry

## 2024-02-27 ENCOUNTER — Emergency Department (HOSPITAL_COMMUNITY)

## 2024-02-27 ENCOUNTER — Encounter (HOSPITAL_COMMUNITY): Payer: Self-pay

## 2024-02-27 ENCOUNTER — Other Ambulatory Visit: Payer: Self-pay

## 2024-02-27 ENCOUNTER — Emergency Department (HOSPITAL_COMMUNITY)
Admission: EM | Admit: 2024-02-27 | Discharge: 2024-02-28 | Disposition: A | Discharge status: Home / Self Care | Attending: Emergency Medicine | Admitting: Emergency Medicine

## 2024-02-27 DIAGNOSIS — Z79899 Other long term (current) drug therapy: Secondary | ICD-10-CM | POA: Diagnosis not present

## 2024-02-27 DIAGNOSIS — I10 Essential (primary) hypertension: Secondary | ICD-10-CM | POA: Insufficient documentation

## 2024-02-27 DIAGNOSIS — E876 Hypokalemia: Secondary | ICD-10-CM | POA: Diagnosis not present

## 2024-02-27 DIAGNOSIS — F17210 Nicotine dependence, cigarettes, uncomplicated: Secondary | ICD-10-CM | POA: Insufficient documentation

## 2024-02-27 DIAGNOSIS — R072 Precordial pain: Secondary | ICD-10-CM | POA: Insufficient documentation

## 2024-02-27 DIAGNOSIS — J45909 Unspecified asthma, uncomplicated: Secondary | ICD-10-CM | POA: Diagnosis not present

## 2024-02-27 LAB — CBC
HCT: 46.8 % — ABNORMAL HIGH (ref 36.0–46.0)
Hemoglobin: 15.6 g/dL — ABNORMAL HIGH (ref 12.0–15.0)
MCH: 33.5 pg (ref 26.0–34.0)
MCHC: 33.3 g/dL (ref 30.0–36.0)
MCV: 100.6 fL — ABNORMAL HIGH (ref 80.0–100.0)
Platelets: 243 10*3/uL (ref 150–400)
RBC: 4.65 MIL/uL (ref 3.87–5.11)
RDW: 12.5 % (ref 11.5–15.5)
WBC: 14.3 10*3/uL — ABNORMAL HIGH (ref 4.0–10.5)
nRBC: 0 % (ref 0.0–0.2)

## 2024-02-27 LAB — BASIC METABOLIC PANEL WITH GFR
Anion gap: 12 (ref 5–15)
BUN: 12 mg/dL (ref 6–20)
CO2: 33 mmol/L — ABNORMAL HIGH (ref 22–32)
Calcium: 9.2 mg/dL (ref 8.9–10.3)
Chloride: 89 mmol/L — ABNORMAL LOW (ref 98–111)
Creatinine, Ser: 0.86 mg/dL (ref 0.44–1.00)
GFR, Estimated: >60 mL/min (ref >60)
Glucose, Bld: 90 mg/dL (ref 70–99)
Potassium: 2.8 mmol/L — ABNORMAL LOW (ref 3.5–5.1)
Sodium: 134 mmol/L — ABNORMAL LOW (ref 135–145)

## 2024-02-27 LAB — TROPONIN I (HIGH SENSITIVITY)
Troponin I (High Sensitivity): 9 ng/L (ref <18)
Troponin I (High Sensitivity): 7 ng/L (ref <18)

## 2024-02-27 MED ORDER — METHOCARBAMOL 500 MG PO TABS
500.0000 mg | ORAL_TABLET | Freq: Once | ORAL | Status: AC
  Administered 2024-02-27: 500 mg via ORAL
  Filled 2024-02-27: qty 1

## 2024-02-27 MED ORDER — ALUM & MAG HYDROXIDE-SIMETH 200-200-20 MG/5ML PO SUSP
30.0000 mL | Freq: Once | ORAL | Status: AC
  Administered 2024-02-27: 30 mL via ORAL
  Filled 2024-02-27: qty 30

## 2024-02-27 MED ORDER — LIDOCAINE 5 % EX PTCH
1.0000 | MEDICATED_PATCH | CUTANEOUS | Status: DC
  Administered 2024-02-27: 1 via TRANSDERMAL
  Filled 2024-02-27: qty 1

## 2024-02-27 MED ORDER — POTASSIUM CHLORIDE CRYS ER 20 MEQ PO TBCR
60.0000 meq | EXTENDED_RELEASE_TABLET | Freq: Once | ORAL | Status: AC
  Administered 2024-02-28: 60 meq via ORAL
  Filled 2024-02-27: qty 3

## 2024-02-27 NOTE — ED Provider Notes (Signed)
 [place] [facility] AT [facility] Provider Note   CSN: [id number] Arrival date & time: 02/27/24  1920     History  Chief Complaint  Patient presents with   Chest Pain    [name] is a 48 y.o. female.  Patient with history of asthma, hypertension, hyperlipidemia, schizophrenia presents today with complaints of chest pain.  She states that pain began 30 minutes prior to arrival today while she was smoking a cigarette and has been persistent since then.  Pain is located throughout her anterior chest area and does not radiate. It is not pleuritic in nature but is reproducible to palpation.  She does note that she has done some heavy lifting recently.  She denies any cardiac history.  No history of similar symptoms previously.  No shortness of breath.  No recent travel or surgeries.  No leg pain or leg swelling.  Denies any nausea or vomiting.  The history is provided by the patient. No language interpreter was used.  Chest Pain      Home Medications Prior to Admission medications   Medication Sig Start Date End Date Taking? Authorizing Provider  ABILIFY 15 MG tablet Take by mouth. 11/10/23   [provider]  albuterol  (VENTOLIN  HFA) 108 (90 Base) MCG/ACT inhaler Inhale 1-2 puffs into the lungs every 6 (six) hours as needed for wheezing or shortness of breath. 06/07/23   Iva Mariner, MD  amLODipine (NORVASC) 10 MG tablet Take 1 tablet every day by oral route with meal(s), for Blood pressure. 08/31/23   [provider]  estradiol  (ESTRACE ) 2 MG tablet Take 1 tablet (2 mg total) by mouth daily. 02/13/24   Javan Messing, NP  hydrochlorothiazide (HYDRODIURIL) 50 MG tablet Take 1 tablet every day by oral route for 90 days. 10/11/23   [provider]  hydrOXYzine  (ATARAX ) 10 MG tablet Take 1 tablet (10 mg total) by mouth 3 (three) times daily as needed. 02/13/24   Javan Messing, NP  loratadine (CLARITIN) 10 MG tablet Take 10 mg by  mouth daily. 02/09/24   [provider]  meclizine  (ANTIVERT ) 25 MG tablet Take 1 tablet (25 mg total) by mouth 3 (three) times daily as needed for dizziness. 07/29/22   Clark, Meghan R, PA-C  meloxicam  (MOBIC ) 7.5 MG tablet Take 7.5 mg by mouth 2 (two) times daily as needed. 02/15/22   [provider]  methocarbamol (ROBAXIN) 500 MG tablet  05/26/22   [provider]  progesterone  (PROMETRIUM ) 200 MG capsule Take 1 daily at bedtime 02/13/24   Griffin, Jennifer A, NP  SYMBICORT 160-4.5 MCG/ACT inhaler Inhale 2 puffs into the lungs 2 (two) times daily. 09/19/23   [provider]      Allergies    Cantaloupe extract allergy skin test, Banana, Egg-derived products, Fluoxetine , and Tomato    Review of Systems   Review of Systems  Cardiovascular:  Positive for chest pain.  All other systems reviewed and are negative.   Physical Exam Updated Vital Signs BP 112/69   Pulse 72   Temp 98.5 F (36.9 C) (Oral)   Resp 18   Ht 5\' 2"  (1.575 m)   Wt 102.7 kg   SpO2 93%   BMI 41.43 kg/m  Physical Exam Vitals and nursing note reviewed.  Constitutional:      General: She is not in acute distress.    Appearance: Normal appearance. She is normal weight. She is not ill-appearing, toxic-appearing or diaphoretic.  HENT:  Head: Normocephalic and atraumatic.  Cardiovascular:     Rate and Rhythm: Normal rate and regular rhythm.     Heart sounds: Normal heart sounds.  Pulmonary:     Effort: Pulmonary effort is normal. No respiratory distress.     Breath sounds: Normal breath sounds.  Chest:     Chest wall: Tenderness present.     Comments: TTP noted to the left anterior chest. No crepitus or deformity, no overlying skin changes Abdominal:     Palpations: Abdomen is soft.     Tenderness: There is no abdominal tenderness.  Musculoskeletal:        General: Normal range of motion.     Cervical back: Normal range of motion.     Right lower leg: No tenderness. No  edema.     Left lower leg: No tenderness. No edema.  Skin:    General: Skin is warm and dry.  Neurological:     General: No focal deficit present.     Mental Status: She is alert.  Psychiatric:        Mood and Affect: Mood normal.        Behavior: Behavior normal.     ED Results / Procedures / Treatments   Labs (all labs ordered are listed, but only abnormal results are displayed) Labs Reviewed  BASIC METABOLIC PANEL WITH GFR - Abnormal; Notable for the following components:      Result Value   Sodium 134 (*)    Potassium 2.8 (*)    Chloride 89 (*)    CO2 33 (*)    All other components within normal limits  CBC - Abnormal; Notable for the following components:   WBC 14.3 (*)    Hemoglobin 15.6 (*)    HCT 46.8 (*)    MCV 100.6 (*)    All other components within normal limits  TROPONIN I (HIGH SENSITIVITY)  TROPONIN I (HIGH SENSITIVITY)    EKG None  Radiology DG Chest 2 View Result Date: 02/27/2024 CLINICAL DATA:  cp EXAM: CHEST - 2 VIEW COMPARISON:  None available. FINDINGS: No focal airspace consolidation, pleural effusion, or pneumothorax. No cardiomegaly. No acute fracture or destructive lesion. IMPRESSION: No acute cardiopulmonary abnormality. Electronically Signed   By: Rance Burrows M.D.   On: 02/27/2024 20:35    Procedures Procedures    Medications Ordered in ED Medications  lidocaine (LIDODERM) 5 % 1 patch (1 patch Transdermal Patch Applied 02/27/24 2245)  alum & mag hydroxide-simeth (MAALOX/MYLANTA) 200-200-20 MG/5ML suspension 30 mL (30 mLs Oral Given 02/27/24 2244)  methocarbamol (ROBAXIN) tablet 500 mg (500 mg Oral Given 02/27/24 2244)    ED Course/ Medical Decision Making/ A&P                                 Medical Decision Making Amount and/or Complexity of Data Reviewed Labs: ordered. Radiology: ordered.  Risk OTC drugs. Prescription drug management.   This patient is a 48 y.o. female who presents to the ED for concern of chest pain, this  involves an extensive number of treatment options, and is a complaint that carries with it a high risk of complications and morbidity. The emergent differential diagnosis prior to evaluation includes, but is not limited to,  ACS, pericarditis, myocarditis, aortic dissection, PE, pneumothorax, esophageal rupture, pneumonia, reflux/PUD, biliary disease, pancreatitis, costochondritis, anxiety   This is not an exhaustive differential.   Past Medical History / Co-morbidities / Social  History:  has a past medical history of Allergy, Anxiety, Arthritis, Asthma, Bipolar 1 disorder (HCC), Depression, Ectopic pregnancy, Encounter for screening fecal occult blood testing (03/24/2023), Hepatitis B infection, Hyperlipidemia, Hypertension, and Schizophrenia (HCC).  Additional history: Chart reviewed.   Physical Exam: Physical exam performed. The pertinent findings include: TTP left anterior chest. No other acute physical exam abnormalities  Lab Tests: I ordered, and personally interpreted labs.  The pertinent results include:  WBC 14.3, K 2.8 (likely due to hydrochlorothiazide), Na 134, chloride 89, bicarb 33. Troponin 9 --> 7   Imaging Studies: I ordered imaging studies including CXR. I independently visualized and interpreted imaging which showed NAD. I agree with the radiologist interpretation.   Cardiac Monitoring:  The patient was maintained on a cardiac monitor.  Cardiac monitor showed an underlying rhythm of: sinus rhythm, no STEMI. I agree with this interpretation.   Medications: I ordered medication including oral potassium, GI cocktail, robaxin, lidocaine patch  for pain, hypokalemia. Reevaluation of the patient after these medicines showed that the patient resolved.  Pain specifically resolved with Robaxin and lidocaine patches.  I have reviewed the patients home medicines and have made adjustments as needed.   Disposition: After consideration of the diagnostic results and the patients response  to treatment, I feel that emergency department workup does not suggest an emergent condition requiring admission or immediate intervention beyond what has been performed at this time. The plan is: Discharge with close outpatient follow-up and return precautions.  After above interventions, patient is feeling better and ready to go home.  She specifically states that the lidocaine patch helped her.  She is PERC negative.  She has a low risk heart score.  She is now asymptomatic.  Doubt AAA or PE.  Suspect muscular cause of pain from her recent increase in heavy lifting particularly with her relief with Robaxin and a lidocaine patch.  Will prescribe same.  Patient advised not to drive or operate machinery while taking Robaxin.  Her potassium was also slightly low, likely due to her HCTZ use.  We did give her oral replacement today.  Will send for a few days of replacement as well.  Recommend that she follow-up closely to have her potassium rechecked and to determine if she needs to be on regular potassium supplementation.  Evaluation and diagnostic testing in the emergency department does not suggest an emergent condition requiring admission or immediate intervention beyond what has been performed at this time.  Plan for discharge with close PCP follow-up.  Patient is understanding and amenable with plan, educated on red flag symptoms that would prompt immediate return.  Patient discharged in stable condition.  Final Clinical Impression(s) / ED Diagnoses Final diagnoses:  Precordial chest pain  Hypokalemia    Rx / DC Orders ED Discharge Orders          Ordered    methocarbamol (ROBAXIN) 500 MG tablet  2 times daily        02/28/24 0017    potassium chloride (KLOR-CON) 10 MEQ tablet  Daily        02/28/24 0017    lidocaine (LIDODERM) 5 %  Every 24 hours        02/28/24 0017          An After Visit Summary was printed and given to the patient.     Cameron Katayama A, PA-C 02/28/24 2202     Iva Mariner, MD 02/28/24 [phone]

## 2024-02-27 NOTE — ED Provider Triage Note (Signed)
 Emergency Medicine Provider Triage Evaluation Note  [name] , a 48 y.o. female  was evaluated in triage.  Pt complains of chest pain. States same began 30 minutes PTA when she was smoking a cigarette. Pain is substernal, doesn't radiate. No SOB. No hx of similar, no cardiac hx  Review of Systems  Positive:  Negative:   Physical Exam  BP 137/68   Pulse 79   Temp 98.2 F (36.8 C) (Oral)   Resp 18   Ht 5\' 2"  (1.575 m)   Wt 102.7 kg   SpO2 94%   BMI 41.43 kg/m  Gen:   Awake, no distress   Resp:  Normal effort  MSK:   Moves extremities without difficulty  Other:    Medical Decision Making  Medically screening exam initiated at 7:52 PM.  Appropriate orders placed.  [name] was informed that the remainder of the evaluation will be completed by another provider, this initial triage assessment does not replace that evaluation, and the importance of remaining in the ED until their evaluation is complete.     Sherra Dk, PA-C 02/27/24 1954

## 2024-02-27 NOTE — ED Provider Notes (Incomplete)
 [place] EMERGENCY DEPARTMENT AT [facility] Provider Note   CSN: [id number] Arrival date & time: 02/27/24  1920     History {Add pertinent medical, surgical, social history, OB history to HPI:1} Chief Complaint  Patient presents with  . Chest Pain    [name] is a 48 y.o. female.  Patient with history of asthma, hypertension, hyperlipidemia, schizophrenia presents today with complaints of chest pain.  She states that pain began 30 minutes prior to arrival today while she was smoking a cigarette and has been persistent since then.  Pain is located throughout her anterior chest area and does not radiate. It is not pleuritic in nature but is reproducible to palpation.  She does note that she has done some heavy lifting recently.  She denies any cardiac history.  No history of similar symptoms previously.  No shortness of breath.  No recent travel or surgeries.  No leg pain or leg swelling.  Denies any nausea or vomiting.  The history is provided by the patient. No language interpreter was used.  Chest Pain      Home Medications Prior to Admission medications   Medication Sig Start Date End Date Taking? Authorizing Provider  ABILIFY 15 MG tablet Take by mouth. 11/10/23   [provider]  albuterol  (VENTOLIN  HFA) 108 (90 Base) MCG/ACT inhaler Inhale 1-2 puffs into the lungs every 6 (six) hours as needed for wheezing or shortness of breath. 06/07/23   Iva Mariner, MD  amLODipine (NORVASC) 10 MG tablet Take 1 tablet every day by oral route with meal(s), for Blood pressure. 08/31/23   [provider]  estradiol  (ESTRACE ) 2 MG tablet Take 1 tablet (2 mg total) by mouth daily. 02/13/24   Javan Messing, NP  hydrochlorothiazide (HYDRODIURIL) 50 MG tablet Take 1 tablet every day by oral route for 90 days. 10/11/23   [provider]  hydrOXYzine  (ATARAX ) 10 MG tablet Take 1 tablet (10 mg total) by mouth 3 (three) times daily as needed. 02/13/24    Javan Messing, NP  loratadine (CLARITIN) 10 MG tablet Take 10 mg by mouth daily. 02/09/24   [provider]  meclizine  (ANTIVERT ) 25 MG tablet Take 1 tablet (25 mg total) by mouth 3 (three) times daily as needed for dizziness. 07/29/22   Clark, Meghan R, PA-C  meloxicam  (MOBIC ) 7.5 MG tablet Take 7.5 mg by mouth 2 (two) times daily as needed. 02/15/22   [provider]  methocarbamol (ROBAXIN) 500 MG tablet  05/26/22   [provider]  progesterone  (PROMETRIUM ) 200 MG capsule Take 1 daily at bedtime 02/13/24   Griffin, Jennifer A, NP  SYMBICORT 160-4.5 MCG/ACT inhaler Inhale 2 puffs into the lungs 2 (two) times daily. 09/19/23   [provider]      Allergies    Cantaloupe extract allergy skin test, Banana, Egg-derived products, Fluoxetine , and Tomato    Review of Systems   Review of Systems  Cardiovascular:  Positive for chest pain.  All other systems reviewed and are negative.   Physical Exam Updated Vital Signs BP 112/69   Pulse 72   Temp 98.5 F (36.9 C) (Oral)   Resp 18   Ht 5\' 2"  (1.575 m)   Wt 102.7 kg   SpO2 93%   BMI 41.43 kg/m  Physical Exam Vitals and nursing note reviewed.  Constitutional:      General: She is not in acute distress.    Appearance: Normal appearance. She is normal weight. She  is not ill-appearing, toxic-appearing or diaphoretic.  HENT:     Head: Normocephalic and atraumatic.  Cardiovascular:     Rate and Rhythm: Normal rate and regular rhythm.     Heart sounds: Normal heart sounds.  Pulmonary:     Effort: Pulmonary effort is normal. No respiratory distress.     Breath sounds: Normal breath sounds.  Chest:     Chest wall: Tenderness present.     Comments: TTP noted to the left anterior chest. No crepitus or deformity, no overlying skin changes Abdominal:     Palpations: Abdomen is soft.     Tenderness: There is no abdominal tenderness.  Musculoskeletal:        General: Normal range of motion.      Cervical back: Normal range of motion.     Right lower leg: No tenderness. No edema.     Left lower leg: No tenderness. No edema.  Skin:    General: Skin is warm and dry.  Neurological:     General: No focal deficit present.     Mental Status: She is alert.  Psychiatric:        Mood and Affect: Mood normal.        Behavior: Behavior normal.     ED Results / Procedures / Treatments   Labs (all labs ordered are listed, but only abnormal results are displayed) Labs Reviewed  BASIC METABOLIC PANEL WITH GFR - Abnormal; Notable for the following components:      Result Value   Sodium 134 (*)    Potassium 2.8 (*)    Chloride 89 (*)    CO2 33 (*)    All other components within normal limits  CBC - Abnormal; Notable for the following components:   WBC 14.3 (*)    Hemoglobin 15.6 (*)    HCT 46.8 (*)    MCV 100.6 (*)    All other components within normal limits  TROPONIN I (HIGH SENSITIVITY)  TROPONIN I (HIGH SENSITIVITY)    EKG None  Radiology DG Chest 2 View Result Date: 02/27/2024 CLINICAL DATA:  cp EXAM: CHEST - 2 VIEW COMPARISON:  None available. FINDINGS: No focal airspace consolidation, pleural effusion, or pneumothorax. No cardiomegaly. No acute fracture or destructive lesion. IMPRESSION: No acute cardiopulmonary abnormality. Electronically Signed   By: Rance Burrows M.D.   On: 02/27/2024 20:35    Procedures Procedures  {Document cardiac monitor, telemetry assessment procedure when appropriate:1}  Medications Ordered in ED Medications  lidocaine (LIDODERM) 5 % 1 patch (1 patch Transdermal Patch Applied 02/27/24 2245)  alum & mag hydroxide-simeth (MAALOX/MYLANTA) 200-200-20 MG/5ML suspension 30 mL (30 mLs Oral Given 02/27/24 2244)  methocarbamol (ROBAXIN) tablet 500 mg (500 mg Oral Given 02/27/24 2244)    ED Course/ Medical Decision Making/ A&P   {   Click here for ABCD2, HEART and other calculatorsREFRESH Note before signing :1}                              Medical  Decision Making Amount and/or Complexity of Data Reviewed Labs: ordered. Radiology: ordered.  Risk OTC drugs. Prescription drug management.   This patient is a 48 y.o. female who presents to the ED for concern of chest pain, this involves an extensive number of treatment options, and is a complaint that carries with it a high risk of complications and morbidity. The emergent differential diagnosis prior to evaluation includes, but is not limited to,  ACS, pericarditis, myocarditis, aortic dissection, PE, pneumothorax, esophageal rupture, pneumonia, reflux/PUD, biliary disease, pancreatitis, costochondritis, anxiety   This is not an exhaustive differential.   Past Medical History / Co-morbidities / Social History:  has a past medical history of Allergy, Anxiety, Arthritis, Asthma, Bipolar 1 disorder (HCC), Depression, Ectopic pregnancy, Encounter for screening fecal occult blood testing (03/24/2023), Hepatitis B infection, Hyperlipidemia, Hypertension, and Schizophrenia (HCC).  Additional history: Chart reviewed.   Physical Exam: Physical exam performed. The pertinent findings include: TTP left anterior chest. No other acute physical exam abnormalities  Lab Tests: I ordered, and personally interpreted labs.  The pertinent results include:  WBC 14.3, K 2.8 (likely due to hydrochlorothiazide), Na 134, chloride 89, bicarb 33. Troponin 9 --> 7   Imaging Studies: I ordered imaging studies including CXR. I independently visualized and interpreted imaging which showed NAD. I agree with the radiologist interpretation.   Cardiac Monitoring:  The patient was maintained on a cardiac monitor.  My attending physician Dr. Aaron Aas viewed and interpreted the cardiac monitored which showed an underlying rhythm of: ***. I agree with this interpretation.   Medications: I ordered medication including ***  for ***. Reevaluation of the patient after these medicines showed that the patient  {resolved/improved/worsened:23923::"improved"}. I have reviewed the patients home medicines and have made adjustments as needed.  Consultations Obtained: I requested consultation with the ***,  and discussed lab and imaging findings as well as pertinent plan - they recommend: ***   Disposition: After consideration of the diagnostic results and the patients response to treatment, I feel that *** .   ***emergency department workup does not suggest an emergent condition requiring admission or immediate intervention beyond what has been performed at this time. The plan is: ***. The patient is safe for discharge and has been instructed to return immediately for worsening symptoms, change in symptoms or any other concerns.  I discussed this case with my attending physician Dr. Aaron Aas who cosigned this note including patient's presenting symptoms, physical exam, and planned diagnostics and interventions. Attending physician stated agreement with plan or made changes to plan which were implemented.     {Document critical care time when appropriate:1} {Document review of labs and clinical decision tools ie heart score, Chads2Vasc2 etc:1}  {Document your independent review of radiology images, and any outside records:1} {Document your discussion with family members, caretakers, and with consultants:1} {Document social determinants of health affecting pt's care:1} {Document your decision making why or why not admission, treatments were needed:1} Final Clinical Impression(s) / ED Diagnoses Final diagnoses:  None    Rx / DC Orders ED Discharge Orders     None

## 2024-02-27 NOTE — ED Triage Notes (Signed)
 Pt bib EMS for chest pain with palpation per EMS pt endorses pain when chest is touched. Pain started today when outside smoking cigarette. Pt also reports dizziness

## 2024-02-28 DIAGNOSIS — R072 Precordial pain: Secondary | ICD-10-CM | POA: Diagnosis not present

## 2024-02-28 MED ORDER — METHOCARBAMOL 500 MG PO TABS: 500.0000 mg | ORAL_TABLET | Freq: Two times a day (BID) | ORAL | 0 refills | Status: DC

## 2024-02-28 MED ORDER — LIDOCAINE 5 % EX PTCH: 1.0000 | MEDICATED_PATCH | CUTANEOUS | 0 refills | Status: DC

## 2024-02-28 MED ORDER — POTASSIUM CHLORIDE ER 10 MEQ PO TBCR: 10.0000 meq | EXTENDED_RELEASE_TABLET | Freq: Every day | ORAL | 0 refills | Status: AC

## 2024-02-28 NOTE — Discharge Instructions (Addendum)
 As we discussed, your workup in the ER today was reassuring for acute findings.  Laboratory evaluation and x-ray imaging as well as an EKG did not reveal any emergent cause of your pain.  Given that your symptoms improved with the medicines we gave you today, I suspect that your pain is muscular in nature.  I have given you a prescription for Robaxin which is a muscle relaxer you can take as prescribed as needed.  Do not drive or operate heavy machinery while take this medication as it can be sedating.  You can also apply a lidocaine patch to your chest as prescribed as needed for pain.  Take over-the-counter pain medicines as well.  Please limit your heavy lifting until your symptoms have resolved.  Additionally, your potassium was low today.  We have given you a dose of a potassium supplement in the emergency department today.  I have also given you a few days of potassium supplement to take.  This is likely due to the HCTZ that you are prescribed.  You should follow-up closely with your PCP to have this rechecked and determine if you need to be on regular potassium supplementation.  Return if development of any new or worsening symptoms.

## 2024-03-01 ENCOUNTER — Encounter: Payer: Self-pay | Admitting: Podiatry

## 2024-03-01 ENCOUNTER — Ambulatory Visit (INDEPENDENT_AMBULATORY_CARE_PROVIDER_SITE_OTHER): Admitting: Podiatry

## 2024-03-01 DIAGNOSIS — B351 Tinea unguium: Secondary | ICD-10-CM | POA: Diagnosis not present

## 2024-03-01 DIAGNOSIS — M79674 Pain in right toe(s): Secondary | ICD-10-CM

## 2024-03-01 DIAGNOSIS — M79675 Pain in left toe(s): Secondary | ICD-10-CM | POA: Diagnosis not present

## 2024-03-01 DIAGNOSIS — G63 Polyneuropathy in diseases classified elsewhere: Secondary | ICD-10-CM

## 2024-03-01 DIAGNOSIS — E538 Deficiency of other specified B group vitamins: Secondary | ICD-10-CM

## 2024-03-01 NOTE — Progress Notes (Signed)
  Subjective:  Patient ID: [name], female    DOB: 1976/03/25,  MRN: [id number]  Chief Complaint  Patient presents with   Nail Problem    RFC, Discolored nails. Nail trim.    48 y.o. female presents with the above complaint. History confirmed with patient. Patient presenting with pain related to dystrophic thickened elongated nails. Patient is unable to trim own nails related to nail dystrophy and/or mobility issues.  Patient reports significant neuropathy to her toes due to vitamin B12 deficiency.  States she is currently getting supplementation from another provider.  Has not had the levels checked in some time.  Objective:  Physical Exam: warm, good capillary refill nail exam onychomycosis of the toenails, onycholysis, and dystrophic nails DP pulses palpable, PT pulses palpable, and protective sensation absent.  Light touch decreased to toes and MTPJ's. Left Foot:  Pain with palpation of nails due to elongation and dystrophic growth.  Right Foot: Pain with palpation of nails due to elongation and dystrophic growth.   Assessment:   1. Pain due to onychomycosis of toenails of both feet   2. Vitamin B12 deficiency neuropathy (HCC)      Plan:  Patient was evaluated and treated and all questions answered.    #Onychomycosis with pain  -Nails palliatively debrided as below. -Educated on self-care  Procedure: Nail Debridement Rationale: Pain Type of Debridement: manual, sharp debridement. Instrumentation: Nail nipper, rotary burr. Number of Nails: 10  # Neuropathy associated vitamin B12 deficiency - Continue supplementation per PCP - States she has not had levels checked in a while, ordering lab work for serum B12 levels  Return in about 3 months (around 06/01/2024) for Routine Foot Care.         Eve Hinders, DPM [facility] / [facility]

## 2024-03-06 ENCOUNTER — Encounter (INDEPENDENT_AMBULATORY_CARE_PROVIDER_SITE_OTHER): Payer: Self-pay | Admitting: *Deleted

## 2024-03-14 ENCOUNTER — Emergency Department (HOSPITAL_COMMUNITY)
Admission: EM | Admit: 2024-03-14 | Discharge: 2024-03-14 | Disposition: A | Discharge status: Home / Self Care | Attending: Emergency Medicine | Admitting: Emergency Medicine

## 2024-03-14 ENCOUNTER — Other Ambulatory Visit: Payer: Self-pay

## 2024-03-14 ENCOUNTER — Emergency Department (HOSPITAL_COMMUNITY)

## 2024-03-14 DIAGNOSIS — Z7951 Long term (current) use of inhaled steroids: Secondary | ICD-10-CM | POA: Diagnosis not present

## 2024-03-14 DIAGNOSIS — J45909 Unspecified asthma, uncomplicated: Secondary | ICD-10-CM | POA: Diagnosis not present

## 2024-03-14 DIAGNOSIS — R600 Localized edema: Secondary | ICD-10-CM | POA: Insufficient documentation

## 2024-03-14 DIAGNOSIS — M79605 Pain in left leg: Secondary | ICD-10-CM | POA: Insufficient documentation

## 2024-03-14 DIAGNOSIS — Z79899 Other long term (current) drug therapy: Secondary | ICD-10-CM | POA: Insufficient documentation

## 2024-03-14 DIAGNOSIS — I10 Essential (primary) hypertension: Secondary | ICD-10-CM | POA: Diagnosis not present

## 2024-03-14 DIAGNOSIS — M7989 Other specified soft tissue disorders: Secondary | ICD-10-CM | POA: Insufficient documentation

## 2024-03-14 LAB — TROPONIN I (HIGH SENSITIVITY)
Troponin I (High Sensitivity): 6 ng/L (ref <18)
Troponin I (High Sensitivity): 6 ng/L (ref <18)

## 2024-03-14 LAB — CBC WITH DIFFERENTIAL/PLATELET
Abs Immature Granulocytes: 0.2 10*3/uL — ABNORMAL HIGH (ref 0.00–0.07)
Basophils Absolute: 0 10*3/uL (ref 0.0–0.1)
Basophils Relative: 0 %
Eosinophils Absolute: 0.1 10*3/uL (ref 0.0–0.5)
Eosinophils Relative: 1 %
HCT: 49.6 % — ABNORMAL HIGH (ref 36.0–46.0)
Hemoglobin: 16.3 g/dL — ABNORMAL HIGH (ref 12.0–15.0)
Immature Granulocytes: 2 %
Lymphocytes Relative: 14 %
Lymphs Abs: 1.9 10*3/uL (ref 0.7–4.0)
MCH: 33.2 pg (ref 26.0–34.0)
MCHC: 32.9 g/dL (ref 30.0–36.0)
MCV: 101 fL — ABNORMAL HIGH (ref 80.0–100.0)
Monocytes Absolute: 0.5 10*3/uL (ref 0.1–1.0)
Monocytes Relative: 4 %
Neutro Abs: 10.5 10*3/uL — ABNORMAL HIGH (ref 1.7–7.7)
Neutrophils Relative %: 79 %
Platelets: 188 10*3/uL (ref 150–400)
RBC: 4.91 MIL/uL (ref 3.87–5.11)
RDW: 12.5 % (ref 11.5–15.5)
WBC: 13.2 10*3/uL — ABNORMAL HIGH (ref 4.0–10.5)
nRBC: 0 % (ref 0.0–0.2)

## 2024-03-14 LAB — COMPREHENSIVE METABOLIC PANEL WITH GFR
ALT: 13 U/L (ref 0–44)
AST: 20 U/L (ref 15–41)
Albumin: 3.7 g/dL (ref 3.5–5.0)
Alkaline Phosphatase: 74 U/L (ref 38–126)
Anion gap: 10 (ref 5–15)
BUN: 15 mg/dL (ref 6–20)
CO2: 33 mmol/L — ABNORMAL HIGH (ref 22–32)
Calcium: 9.4 mg/dL (ref 8.9–10.3)
Chloride: 89 mmol/L — ABNORMAL LOW (ref 98–111)
Creatinine, Ser: 0.92 mg/dL (ref 0.44–1.00)
GFR, Estimated: >60 mL/min (ref >60)
Glucose, Bld: 104 mg/dL — ABNORMAL HIGH (ref 70–99)
Potassium: 3.3 mmol/L — ABNORMAL LOW (ref 3.5–5.1)
Sodium: 132 mmol/L — ABNORMAL LOW (ref 135–145)
Total Bilirubin: 0.8 mg/dL (ref 0.0–1.2)
Total Protein: 7.9 g/dL (ref 6.5–8.1)

## 2024-03-14 LAB — D-DIMER, QUANTITATIVE: D-Dimer, Quant: 0.41 ug{FEU}/mL (ref 0.00–0.50)

## 2024-03-14 LAB — BRAIN NATRIURETIC PEPTIDE: B Natriuretic Peptide: 40 pg/mL (ref 0.0–100.0)

## 2024-03-14 MED ORDER — NAPROXEN 250 MG PO TABS
500.0000 mg | ORAL_TABLET | Freq: Once | ORAL | Status: AC
  Administered 2024-03-14: 500 mg via ORAL
  Filled 2024-03-14: qty 2

## 2024-03-14 NOTE — ED Triage Notes (Signed)
 Pt arrived via POV c/o left leg swelling X 2 weeks. Pt ambulatory in Triage. Pt denies medication changes, denies injury.

## 2024-03-14 NOTE — ED Notes (Signed)
 Pts O2 86-91% while resting in bed. Dr. Annabell Key made aware. Orders placed.

## 2024-03-14 NOTE — Discharge Instructions (Addendum)
 thankfully your ultrasound does not show any signs of blood clot.  You can take Naprosyn twice a day to help with the pain.  Please let your doctor know that if your legs continue to swell they might need to adjust your blood pressure medications because amlodipine which you take once a day can cause lower extremity swelling.  ER for your worsening pain swelling shortness of breath or chest pain  I would also recommend using a compression stocking on your legs to help with the swelling, you can pick this up at your local pharmacy    Please take Naprosyn, 500mg  by mouth twice daily as needed for pain - this in an antiinflammatory medicine (NSAID) and is similar to ibuprofen  - many people feel that it is stronger than ibuprofen  and it is easier to take since it is a smaller pill.  Please use this only for 1 week - if your pain persists, you will need to follow up with your doctor in the office for ongoing guidance and pain control.     thank you for allowing us  to treat you in the emergency department today.  After reviewing your examination and potential testing that was done it appears that you are safe to go home.  I would like for you to follow-up with your doctor within the next several days, have them obtain your records and follow-up with them to review all potential tests and results from your visit.  If you should develop severe or worsening symptoms return to the emergency department immediately

## 2024-03-14 NOTE — ED Provider Notes (Signed)
 [place] EMERGENCY DEPARTMENT AT [facility] Provider Note   CSN: [id number] Arrival date & time: 03/14/24  6433     History  Chief Complaint  Patient presents with   Leg Swelling    [name] is a 48 y.o. female.  HPI   This is a 48 year old female, she has a history of hypertension on amlodipine, history of some reactive airway disease and is on Abilify, she presents to the hospital with complaint of left leg swelling for the last 2 weeks.  No shortness of breath coughing or fevers.  She was seen at her doctor's office this morning and told to come over to the ER for an ultrasound of her leg.  No history of DVT, she is not anticoagulated.  Home Medications Prior to Admission medications   Medication Sig Start Date End Date Taking? Authorizing Provider  ABILIFY 15 MG tablet Take by mouth. 11/10/23   [provider]  albuterol  (VENTOLIN  HFA) 108 (90 Base) MCG/ACT inhaler Inhale 1-2 puffs into the lungs every 6 (six) hours as needed for wheezing or shortness of breath. 06/07/23   Iva Mariner, MD  amLODipine (NORVASC) 10 MG tablet Take 1 tablet every day by oral route with meal(s), for Blood pressure. 08/31/23   [provider]  estradiol  (ESTRACE ) 2 MG tablet Take 1 tablet (2 mg total) by mouth daily. 02/13/24   Javan Messing, NP  hydrochlorothiazide (HYDRODIURIL) 50 MG tablet Take 1 tablet every day by oral route for 90 days. 10/11/23   [provider]  hydrOXYzine  (ATARAX ) 10 MG tablet Take 1 tablet (10 mg total) by mouth 3 (three) times daily as needed. 02/13/24   Javan Messing, NP  lidocaine  (LIDODERM ) 5 % Place 1 patch onto the skin daily. Remove & Discard patch within 12 hours or as directed by MD 02/28/24   Smoot, Genevive Ket, PA-C  loratadine (CLARITIN) 10 MG tablet Take 10 mg by mouth daily. 02/09/24   [provider]  meclizine  (ANTIVERT ) 25 MG tablet Take 1 tablet (25 mg total) by mouth 3 (three) times daily as needed  for dizziness. 07/29/22   Clark, Meghan R, PA-C  meloxicam  (MOBIC ) 7.5 MG tablet Take 7.5 mg by mouth 2 (two) times daily as needed. 02/15/22   [provider]  methocarbamol  (ROBAXIN ) 500 MG tablet Take 1 tablet (500 mg total) by mouth 2 (two) times daily. 02/28/24   Smoot, Genevive Ket, PA-C  potassium chloride  (KLOR-CON ) 10 MEQ tablet Take 1 tablet (10 mEq total) by mouth daily. 02/28/24   Smoot, Genevive Ket, PA-C  progesterone  (PROMETRIUM ) 200 MG capsule Take 1 daily at bedtime 02/13/24   Griffin, Jennifer A, NP  SYMBICORT 160-4.5 MCG/ACT inhaler Inhale 2 puffs into the lungs 2 (two) times daily. 09/19/23   [provider]      Allergies    Cantaloupe extract allergy skin test, Banana, Egg-derived products, Fluoxetine , and Tomato    Review of Systems   Review of Systems  All other systems reviewed and are negative.   Physical Exam Updated Vital Signs BP 131/74 (BP Location: Right Arm)   Pulse 74   Temp 98.2 F (36.8 C) (Oral)   Resp 18   Ht 1.575 m (5\' 2" )   Wt 102.7 kg   SpO2 96%   BMI 41.43 kg/m  Physical Exam Vitals and nursing note reviewed.  Constitutional:      General: She is not in acute distress.    Appearance: She is  well-developed.  HENT:     Head: Normocephalic and atraumatic.     Mouth/Throat:     Pharynx: No oropharyngeal exudate.  Eyes:     General: No scleral icterus.       Right eye: No discharge.        Left eye: No discharge.     Conjunctiva/sclera: Conjunctivae normal.     Pupils: Pupils are equal, round, and reactive to light.  Neck:     Thyroid : No thyromegaly.     Vascular: No JVD.  Cardiovascular:     Rate and Rhythm: Normal rate and regular rhythm.     Heart sounds: Normal heart sounds. No murmur heard.    No friction rub. No gallop.  Pulmonary:     Effort: Pulmonary effort is normal. No respiratory distress.     Breath sounds: Normal breath sounds. No wheezing or rales.  Abdominal:     General: Bowel sounds are normal. There is no  distension.     Palpations: Abdomen is soft. There is no mass.     Tenderness: There is no abdominal tenderness.  Musculoskeletal:        General: No tenderness. Normal range of motion.     Cervical back: Normal range of motion and neck supple.     Left lower leg: Edema present.     Comments: Mild asymmetry of the lower extremities with mild edema of the left lower extremity.  Normal pulses at the feet bilaterally  Lymphadenopathy:     Cervical: No cervical adenopathy.  Skin:    General: Skin is warm and dry.     Findings: No erythema or rash.  Neurological:     Mental Status: She is alert.     Coordination: Coordination normal.  Psychiatric:        Behavior: Behavior normal.     ED Results / Procedures / Treatments   Labs (all labs ordered are listed, but only abnormal results are displayed) Labs Reviewed  CBC WITH DIFFERENTIAL/PLATELET - Abnormal; Notable for the following components:      Result Value   WBC 13.2 (*)    Hemoglobin 16.3 (*)    HCT 49.6 (*)    MCV 101.0 (*)    Neutro Abs 10.5 (*)    Abs Immature Granulocytes 0.20 (*)    All other components within normal limits  COMPREHENSIVE METABOLIC PANEL WITH GFR - Abnormal; Notable for the following components:   Sodium 132 (*)    Potassium 3.3 (*)    Chloride 89 (*)    CO2 33 (*)    Glucose, Bld 104 (*)    All other components within normal limits  BRAIN NATRIURETIC PEPTIDE  TROPONIN I (HIGH SENSITIVITY)  TROPONIN I (HIGH SENSITIVITY)    EKG None  Radiology US  Venous Img Lower Unilateral Left (DVT) Result Date: 03/14/2024 CLINICAL DATA:  LEFT lower extremity swelling for 2 weeks EXAM: LEFT LOWER EXTREMITY VENOUS DOPPLER ULTRASOUND TECHNIQUE: Gray-scale sonography with compression, as well as color and duplex ultrasound, were performed to evaluate the deep venous system(s) from the level of the common femoral vein through the popliteal and proximal calf veins. COMPARISON:  None available FINDINGS: VENOUS Normal  compressibility of the common femoral, superficial femoral, and popliteal veins, as well as the visualized calf veins. Visualized portions of profunda femoral vein and great saphenous vein unremarkable. No filling defects to suggest DVT on grayscale or color Doppler imaging. Doppler waveforms show normal direction of venous flow, normal respiratory plasticity and  response to augmentation. Limited views of the contralateral common femoral vein are unremarkable. OTHER None. Limitations: none IMPRESSION: No DVT of the left lower extremity. Electronically Signed   By: Elester Grim M.D.   On: 03/14/2024 15:24    Procedures Procedures    Medications Ordered in ED Medications  naproxen (NAPROSYN) tablet 500 mg (has no administration in time range)    ED Course/ Medical Decision Making/ A&P                                 Medical Decision Making Amount and/or Complexity of Data Reviewed Labs: ordered. Radiology: ordered.  Risk Prescription drug management.    This patient presents to the ED for concern of unilateral leg swelling differential diagnosis includes DVT, muscular injury, there is only a slight asymmetry of the ankle but otherwise the legs are inseparable in appearance, there is no redness no induration no signs of infection, she is not tachycardic or febrile or hypotensive.  She has no complaints of respiratory or cardiopulmonary symptoms    Additional history obtained:  Additional history obtained from medical record External records from outside source obtained and reviewed including prior labs, of note the patient had a leukocytosis of 14,002 weeks ago and today is 13,000, she had been seen for chest pain 2 weeks ago.  She is not having that complaint today.   Lab Tests:  I Ordered, and personally interpreted labs.  The pertinent results include: Mild leukocytosis   Imaging Studies ordered:  I ordered imaging studies including ultrasound of the leg I independently  visualized and interpreted imaging which showed  no signs of DVT I agree with the radiologist interpretation   Medicines ordered and prescription drug management:  I ordered medication including  Naprosyn  for  pain   I have reviewed the patients home medicines and have made adjustments as needed   Problem List / ED Course:   no signs of DVT, stable for discharge   Social Determinants of Health:   none           Final Clinical Impression(s) / ED Diagnoses Final diagnoses:  Lower extremity pain, left    Rx / DC Orders ED Discharge Orders     None         Early Glisson, MD 03/17/24 [phone]

## 2024-03-14 NOTE — ED Notes (Signed)
 Ambulated pt with pulse ox; o2 stayed between 76%-80% while amb; pt stated she did not feel sob or dizzy while amb; o2 did not go above 80% while amb; RN notified; pt now in bed with call light within reach

## 2024-04-22 ENCOUNTER — Encounter (HOSPITAL_COMMUNITY): Payer: Self-pay | Admitting: Emergency Medicine

## 2024-04-22 ENCOUNTER — Other Ambulatory Visit: Payer: Self-pay

## 2024-04-22 ENCOUNTER — Emergency Department (HOSPITAL_COMMUNITY)
Admission: EM | Admit: 2024-04-22 | Discharge: 2024-04-22 | Disposition: A | Discharge status: Home / Self Care | Attending: Emergency Medicine | Admitting: Emergency Medicine

## 2024-04-22 DIAGNOSIS — H60502 Unspecified acute noninfective otitis externa, left ear: Secondary | ICD-10-CM | POA: Insufficient documentation

## 2024-04-22 DIAGNOSIS — H9202 Otalgia, left ear: Secondary | ICD-10-CM | POA: Diagnosis present

## 2024-04-22 MED ORDER — OFLOXACIN 0.3 % OT SOLN: 10.0000 [drp] | Freq: Every day | OTIC | 0 refills | Status: AC

## 2024-04-22 NOTE — ED Triage Notes (Signed)
 Pt via POV c/o left ear pain since this morning with muffled hearing on that side. No sore throat. No other symptoms reported.

## 2024-04-22 NOTE — Discharge Instructions (Signed)
 Please follow-up closely with your primary care doctor on outpatient basis for reevaluation.  Return to emergency department immediately for any new or worsening symptoms.

## 2024-04-22 NOTE — ED Notes (Signed)
 Flushed patients ear with about 200 mL of saline and nothing came out of patients ear EDP notified.

## 2024-04-22 NOTE — ED Provider Notes (Signed)
 [facility] AT [facility] Provider Note   CSN: [id number] Arrival date & time: 04/22/24  1128     Patient presents with: Otalgia   [name] is a 48 y.o. female.   Patient is a 48 year old female who presents emergency department chief complaint of left-sided ear pain which has been ongoing since this morning.  She denies any discharge from her ear.  She notes that she feels as though her hearing is muffled and that her ear is swollen.  She denies any associated fever, chills, cough, congestion, rhinorrhea, sore throat.   Otalgia      Prior to Admission medications   Medication Sig Start Date End Date Taking? Authorizing Provider  ofloxacin (FLOXIN) 0.3 % OTIC solution Place 10 drops into the left ear daily for 7 days. 04/22/24 04/29/24 Yes Marnell Mcdaniel D, PA-C  ABILIFY 15 MG tablet Take by mouth. 11/10/23   [provider]  albuterol  (VENTOLIN  HFA) 108 (90 Base) MCG/ACT inhaler Inhale 1-2 puffs into the lungs every 6 (six) hours as needed for wheezing or shortness of breath. 06/07/23   Melvenia Motto, MD  amLODipine (NORVASC) 10 MG tablet Take 1 tablet every day by oral route with meal(s), for Blood pressure. 08/31/23   [provider]  estradiol  (ESTRACE ) 2 MG tablet Take 1 tablet (2 mg total) by mouth daily. 02/13/24   Signa Delon LABOR, NP  hydrochlorothiazide (HYDRODIURIL) 50 MG tablet Take 1 tablet every day by oral route for 90 days. 10/11/23   [provider]  hydrOXYzine  (ATARAX ) 10 MG tablet Take 1 tablet (10 mg total) by mouth 3 (three) times daily as needed. 02/13/24   Signa Delon LABOR, NP  lidocaine  (LIDODERM ) 5 % Place 1 patch onto the skin daily. Remove & Discard patch within 12 hours or as directed by MD 02/28/24   Smoot, Lauraine LABOR, PA-C  loratadine (CLARITIN) 10 MG tablet Take 10 mg by mouth daily. 02/09/24   [provider]  meclizine  (ANTIVERT ) 25 MG tablet Take 1 tablet (25 mg total) by mouth 3  (three) times daily as needed for dizziness. 07/29/22   Clark, Meghan R, PA-C  meloxicam  (MOBIC ) 7.5 MG tablet Take 7.5 mg by mouth 2 (two) times daily as needed. 02/15/22   [provider]  methocarbamol  (ROBAXIN ) 500 MG tablet Take 1 tablet (500 mg total) by mouth 2 (two) times daily. 02/28/24   Smoot, Lauraine LABOR, PA-C  potassium chloride  (KLOR-CON ) 10 MEQ tablet Take 1 tablet (10 mEq total) by mouth daily. 02/28/24   Smoot, Lauraine LABOR, PA-C  progesterone  (PROMETRIUM ) 200 MG capsule Take 1 daily at bedtime 02/13/24   Griffin, Jennifer A, NP  SYMBICORT 160-4.5 MCG/ACT inhaler Inhale 2 puffs into the lungs 2 (two) times daily. 09/19/23   [provider]    Allergies: Cantaloupe extract allergy skin test, Banana, Egg-derived products, Fluoxetine , and Tomato    Review of Systems  HENT:  Positive for ear pain.   All other systems reviewed and are negative.   Updated Vital Signs BP 118/68   Pulse 69   Temp 98 F (36.7 C) (Oral)   Resp 18   Ht 5' 2 (1.575 m)   Wt 103.4 kg   SpO2 99%   BMI 41.70 kg/m   Physical Exam Vitals and nursing note reviewed.  Constitutional:      Appearance: Normal appearance.  HENT:     Head: Normocephalic and atraumatic.     Ears:  Comments: Left ear canal with erythema and purulence, external ear unremarkable, minimal TM visualized but with no overlying erythema or bulging, no mastoid tenderness    Nose: Nose normal.     Mouth/Throat:     Mouth: Mucous membranes are moist.   Eyes:     Extraocular Movements: Extraocular movements intact.     Conjunctiva/sclera: Conjunctivae normal.     Pupils: Pupils are equal, round, and reactive to light.    Cardiovascular:     Rate and Rhythm: Normal rate and regular rhythm.     Pulses: Normal pulses.  Pulmonary:     Effort: Pulmonary effort is normal. No respiratory distress.  Abdominal:     General: Bowel sounds are normal.   Musculoskeletal:        General: Normal range of motion.     Cervical  back: Normal range of motion and neck supple. No rigidity or tenderness.   Skin:    General: Skin is warm and dry.   Neurological:     General: No focal deficit present.     Mental Status: She is alert and oriented to person, place, and time. Mental status is at baseline.   Psychiatric:        Mood and Affect: Mood normal.        Behavior: Behavior normal.        Thought Content: Thought content normal.        Judgment: Judgment normal.     (all labs ordered are listed, but only abnormal results are displayed) Labs Reviewed - No data to display  EKG: None  Radiology: No results found.   Procedures   Medications Ordered in the ED - No data to display                                  Medical Decision Making Patient is doing well at this time and is stable for discharge home.  Discussed with patient that we will cover her for otitis externa at this time.  She has no indication for otitis media at this point.  She has no signs of malignant otitis externa, perichondritis, mastoiditis.  Patient has stable vital signs at this point with no indication for sepsis.  The need for close follow-up with her primary care doctor on an outpatient basis was discussed as well as strict turn precautions for any new or worsening symptoms.  Patient voiced understanding to the plan and had no additional questions.  Risk Prescription drug management.        Final diagnoses:  Acute otitis externa of left ear, unspecified type    ED Discharge Orders          Ordered    ofloxacin (FLOXIN) 0.3 % OTIC solution  Daily        04/22/24 1407               [name] 04/22/24 1444    Suzette Pac, MD 04/23/24 [phone]

## 2024-05-30 ENCOUNTER — Other Ambulatory Visit: Payer: Self-pay | Admitting: Podiatry

## 2024-05-30 DIAGNOSIS — G63 Polyneuropathy in diseases classified elsewhere: Secondary | ICD-10-CM

## 2024-05-30 NOTE — Progress Notes (Signed)
 New lab order for vitamin b12 levels sent in to [facility]

## 2024-06-04 ENCOUNTER — Telehealth: Payer: Self-pay | Admitting: Lab

## 2024-06-04 NOTE — Telephone Encounter (Signed)
 Patient calling inquiring about blood work she is suppose to have done.

## 2024-06-05 NOTE — Telephone Encounter (Signed)
 Labs ordered by Dr.Semon patient informed.

## 2024-06-07 ENCOUNTER — Ambulatory Visit: Admitting: Podiatry

## 2024-06-07 LAB — VITAMIN B12: Vitamin B-12: 393 pg/mL (ref 232–1245)

## 2024-06-14 ENCOUNTER — Ambulatory Visit (INDEPENDENT_AMBULATORY_CARE_PROVIDER_SITE_OTHER): Admitting: Adult Health

## 2024-06-14 ENCOUNTER — Encounter: Payer: Self-pay | Admitting: Adult Health

## 2024-06-14 VITALS — BP 117/79 | HR 73 | Ht 62.0 in | Wt 239.5 lb

## 2024-06-14 DIAGNOSIS — Z1331 Encounter for screening for depression: Secondary | ICD-10-CM

## 2024-06-14 DIAGNOSIS — R4589 Other symptoms and signs involving emotional state: Secondary | ICD-10-CM

## 2024-06-14 DIAGNOSIS — Z3202 Encounter for pregnancy test, result negative: Secondary | ICD-10-CM | POA: Diagnosis not present

## 2024-06-14 DIAGNOSIS — G479 Sleep disorder, unspecified: Secondary | ICD-10-CM

## 2024-06-14 DIAGNOSIS — R61 Generalized hyperhidrosis: Secondary | ICD-10-CM | POA: Diagnosis not present

## 2024-06-14 DIAGNOSIS — Z79890 Hormone replacement therapy (postmenopausal): Secondary | ICD-10-CM | POA: Diagnosis not present

## 2024-06-14 DIAGNOSIS — R232 Flushing: Secondary | ICD-10-CM

## 2024-06-14 DIAGNOSIS — Z780 Asymptomatic menopausal state: Secondary | ICD-10-CM

## 2024-06-14 LAB — POCT URINE PREGNANCY: Preg Test, Ur: NEGATIVE

## 2024-06-14 MED ORDER — ESTRADIOL 2 MG PO TABS
2.0000 mg | ORAL_TABLET | Freq: Every day | ORAL | 6 refills | Status: AC
Start: 2024-06-14 — End: ?

## 2024-06-14 MED ORDER — PROGESTERONE 200 MG PO CAPS: ORAL_CAPSULE | ORAL | 6 refills | Status: AC

## 2024-06-14 NOTE — Progress Notes (Signed)
 Subjective:     Patient ID: [name], female   DOB: 09-06-1976, 48 y.o.   MRN: [id number]  HPI [name] is a 48 year old black female,single, PM in for follow up on taking estrace  and prometrium , no hot flashes or night sweats, still not sleeping well and is moody. She requests UPT.     Component Value Date/Time   DIAGPAP  03/24/2023 0925    - Negative for intraepithelial lesion or malignancy (NILM)   DIAGPAP  04/07/2020 1032    - Negative for intraepithelial lesion or malignancy (NILM)   HPVHIGH Negative 03/24/2023 0925   HPVHIGH Negative 04/07/2020 1032   ADEQPAP  03/24/2023 0925    Satisfactory for evaluation; transformation zone component ABSENT.   ADEQPAP  04/07/2020 1032    Satisfactory for evaluation; transformation zone component ABSENT.    PCP Cesar Nsumanganyi  Review of Systems No hot flashes, or night sweats Still not sleeping great and is moody Has had some swelling in legs and back spasms, to see PCP Denies any vaginal bleeding Reviewed past medical,surgical, social and family history. Reviewed medications and allergies.     Objective:   Physical Exam BP 117/79 (BP Location: Left Arm, Patient Position: Sitting, Cuff Size: Large)   Pulse 73   Ht 5' 2 (1.575 m)   Wt 239 lb 8 oz (108.6 kg)   BMI 43.81 kg/m  UPT is negative Skin warm and dry.  Lungs: clear to ausculation bilaterally. Cardiovascular: regular rate and rhythm.    Fall risk is low    06/14/2024    8:43 AM 02/13/2024   10:18 AM 03/24/2023    8:35 AM  Depression screen PHQ 2/9  Decreased Interest 3 0 1  Down, Depressed, Hopeless 3 3 3   PHQ - 2 Score 6 3 4   Altered sleeping 3 3 1   Tired, decreased energy 3 3 1   Change in appetite 2 2 3   Feeling bad or failure about yourself  0 3 0  Trouble concentrating 0 0 0  Moving slowly or fidgety/restless 3 0 3  Suicidal thoughts 0 0 0  PHQ-9 Score 17 14 12   Difficult doing work/chores  Somewhat difficult    On meds, abilify     06/14/2024     8:44 AM 02/13/2024   10:20 AM 03/24/2023    8:35 AM 04/07/2020   10:30 AM  GAD 7 : Generalized Anxiety Score  Nervous, Anxious, on Edge 3 3 3 3   Control/stop worrying 0 3 0 2  Worry too much - different things 2 3 1 3   Trouble relaxing 0 3 3 3   Restless 1 3 1 3   Easily annoyed or irritable 3 3 0 2  Afraid - awful might happen 0 3 0 0  Total GAD 7 Score 9 21 8 16   Anxiety Difficulty  Somewhat difficult      Upstream - 06/14/24 0841       Pregnancy Intention Screening   Does the patient want to become pregnant in the next year? N/A    Does the patient's partner want to become pregnant in the next year? N/A    Would the patient like to discuss contraceptive options today? N/A      Contraception Wrap Up   Current Method No Method - Other Reason   PM   End Method No Method - Other Reason   PM   Contraception Counseling Provided No            Assessment:  1. Negative pregnancy test - POCT urine pregnancy  2. Hormone replacement therapy (Primary) She wants to continue estrace  and Prometrium , will refill Meds ordered this encounter  Medications   estradiol  (ESTRACE ) 2 MG tablet    Sig: Take 1 tablet (2 mg total) by mouth daily.    Dispense:  30 tablet    Refill:  6    Supervising Provider:   JAYNE MINDER H [[id number]]   progesterone  (PROMETRIUM ) 200 MG capsule    Sig: Take 1 daily at bedtime    Dispense:  30 capsule    Refill:  6    Supervising Provider:   JAYNE MINDER H [[id number]]   Decrease smoking, smoking about 6 a day  3. Hot flashes Have resolved on HRT   4. Night sweats Have resolved on HRT  5. Moody Is on abilify, talk further with PCP  6. Sleep disturbance Not sleeping well, talk with PCP  7. Postmenopause Denies any vaginal bleeding     Plan:     Follow up in 6 months or sooner if needed

## 2024-06-20 ENCOUNTER — Ambulatory Visit: Payer: Self-pay | Admitting: Podiatry

## 2024-06-27 NOTE — Progress Notes (Signed)
 Left results on vm. Requested call back if any other questions.

## 2024-07-12 ENCOUNTER — Encounter (HOSPITAL_COMMUNITY): Payer: Self-pay | Admitting: Adult Health

## 2024-07-18 ENCOUNTER — Ambulatory Visit (INDEPENDENT_AMBULATORY_CARE_PROVIDER_SITE_OTHER): Admitting: Podiatry

## 2024-07-18 ENCOUNTER — Other Ambulatory Visit (HOSPITAL_COMMUNITY): Payer: Self-pay | Admitting: Adult Health

## 2024-07-18 ENCOUNTER — Encounter: Payer: Self-pay | Admitting: Podiatry

## 2024-07-18 DIAGNOSIS — M79674 Pain in right toe(s): Secondary | ICD-10-CM

## 2024-07-18 DIAGNOSIS — R921 Mammographic calcification found on diagnostic imaging of breast: Secondary | ICD-10-CM

## 2024-07-18 DIAGNOSIS — G6289 Other specified polyneuropathies: Secondary | ICD-10-CM

## 2024-07-18 DIAGNOSIS — M79675 Pain in left toe(s): Secondary | ICD-10-CM

## 2024-07-18 DIAGNOSIS — B351 Tinea unguium: Secondary | ICD-10-CM

## 2024-07-18 NOTE — Progress Notes (Signed)

## 2024-07-24 ENCOUNTER — Encounter (HOSPITAL_COMMUNITY): Payer: Self-pay

## 2024-07-24 ENCOUNTER — Ambulatory Visit (HOSPITAL_COMMUNITY)
Admission: RE | Admit: 2024-07-24 | Discharge: 2024-07-24 | Disposition: A | Discharge status: Home / Self Care | Source: Ambulatory Visit | Attending: Adult Health | Admitting: Adult Health

## 2024-07-24 DIAGNOSIS — R921 Mammographic calcification found on diagnostic imaging of breast: Secondary | ICD-10-CM | POA: Diagnosis present

## 2024-11-01 ENCOUNTER — Ambulatory Visit: Admitting: Podiatry

## 2024-11-01 ENCOUNTER — Encounter: Payer: Self-pay | Admitting: Podiatry

## 2024-11-01 DIAGNOSIS — M79674 Pain in right toe(s): Secondary | ICD-10-CM

## 2024-11-01 DIAGNOSIS — G6289 Other specified polyneuropathies: Secondary | ICD-10-CM

## 2024-11-01 DIAGNOSIS — M79675 Pain in left toe(s): Secondary | ICD-10-CM

## 2024-11-01 DIAGNOSIS — B351 Tinea unguium: Secondary | ICD-10-CM

## 2024-11-01 MED ORDER — GABAPENTIN 100 MG PO CAPS: 100.0000 mg | ORAL_CAPSULE | Freq: Three times a day (TID) | ORAL | 0 refills | Status: DC

## 2024-11-01 NOTE — Progress Notes (Unsigned)
 Nails Will start gabapentin  100 mg based on pain that she gets from her toes at nighttime does report burning tingling and spasm sensations.  Did recommend that she also have her back pain evaluate she does report some back spasms.

## 2024-11-26 ENCOUNTER — Other Ambulatory Visit: Payer: Self-pay | Admitting: Podiatry

## 2024-11-26 DIAGNOSIS — G6289 Other specified polyneuropathies: Secondary | ICD-10-CM

## 2024-12-03 ENCOUNTER — Ambulatory Visit: Admitting: Podiatry

## 2024-12-24 ENCOUNTER — Ambulatory Visit: Admitting: Podiatry

## 2025-01-31 ENCOUNTER — Ambulatory Visit: Admitting: Podiatry
